# Patient Record
Sex: Female | Born: 1950 | Race: Black or African American | Hispanic: No | Smoking: Former smoker
Health system: Southern US, Community
[De-identification: ages and names within clinical notes are randomized; demographics above are authoritative.]

## PROBLEM LIST (undated history)

## (undated) DIAGNOSIS — R51 Headache: Secondary | ICD-10-CM

## (undated) DIAGNOSIS — I10 Essential (primary) hypertension: Secondary | ICD-10-CM

## (undated) DIAGNOSIS — K219 Gastro-esophageal reflux disease without esophagitis: Secondary | ICD-10-CM

## (undated) DIAGNOSIS — R7303 Prediabetes: Secondary | ICD-10-CM

## (undated) DIAGNOSIS — H40053 Ocular hypertension, bilateral: Secondary | ICD-10-CM

## (undated) DIAGNOSIS — J45909 Unspecified asthma, uncomplicated: Secondary | ICD-10-CM

## (undated) DIAGNOSIS — R519 Headache, unspecified: Secondary | ICD-10-CM

## (undated) DIAGNOSIS — M1990 Unspecified osteoarthritis, unspecified site: Secondary | ICD-10-CM

## (undated) HISTORY — PX: TUBAL LIGATION: SHX77

---
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
## 2000-05-01 ENCOUNTER — Encounter: Payer: Self-pay | Admitting: Emergency Medicine

## 2000-05-01 ENCOUNTER — Emergency Department (HOSPITAL_COMMUNITY): Admission: EM | Admit: 2000-05-01 | Discharge: 2000-05-01 | Payer: Self-pay | Admitting: Emergency Medicine

## 2001-03-02 ENCOUNTER — Encounter: Payer: Self-pay | Admitting: Emergency Medicine

## 2001-03-02 ENCOUNTER — Inpatient Hospital Stay (HOSPITAL_COMMUNITY): Admission: EM | Admit: 2001-03-02 | Discharge: 2001-03-06 | Payer: Self-pay | Admitting: Emergency Medicine

## 2001-03-03 ENCOUNTER — Encounter: Payer: Self-pay | Admitting: Cardiology

## 2001-03-09 ENCOUNTER — Emergency Department (HOSPITAL_COMMUNITY): Admission: EM | Admit: 2001-03-09 | Discharge: 2001-03-09 | Payer: Self-pay | Admitting: Emergency Medicine

## 2001-04-06 ENCOUNTER — Encounter: Payer: Self-pay | Admitting: Cardiology

## 2001-04-06 ENCOUNTER — Ambulatory Visit (HOSPITAL_COMMUNITY): Admission: RE | Admit: 2001-04-06 | Discharge: 2001-04-06 | Payer: Self-pay | Admitting: Cardiology

## 2001-12-04 ENCOUNTER — Emergency Department (HOSPITAL_COMMUNITY): Admission: EM | Admit: 2001-12-04 | Discharge: 2001-12-05 | Payer: Self-pay | Admitting: Emergency Medicine

## 2001-12-05 ENCOUNTER — Encounter: Payer: Self-pay | Admitting: Emergency Medicine

## 2004-11-28 ENCOUNTER — Emergency Department: Payer: Self-pay | Admitting: Emergency Medicine

## 2005-10-21 ENCOUNTER — Ambulatory Visit: Payer: Self-pay | Admitting: Unknown Physician Specialty

## 2006-02-26 ENCOUNTER — Ambulatory Visit: Payer: Self-pay | Admitting: Unknown Physician Specialty

## 2006-10-22 ENCOUNTER — Emergency Department: Payer: Self-pay | Admitting: Emergency Medicine

## 2008-01-20 ENCOUNTER — Ambulatory Visit: Payer: Self-pay | Admitting: Unknown Physician Specialty

## 2008-03-29 ENCOUNTER — Ambulatory Visit: Payer: Self-pay | Admitting: Unknown Physician Specialty

## 2009-05-08 IMAGING — MG MAM DGTL SCREENING MAMMO W/CAD
1 series · 4 of 4 positions shown · non-contrast
Comparison: none

REASON FOR EXAM: Screening Mammo
COMMENTS:

[R CC · right · 4 of 4 slices shown]
[im 1/4]
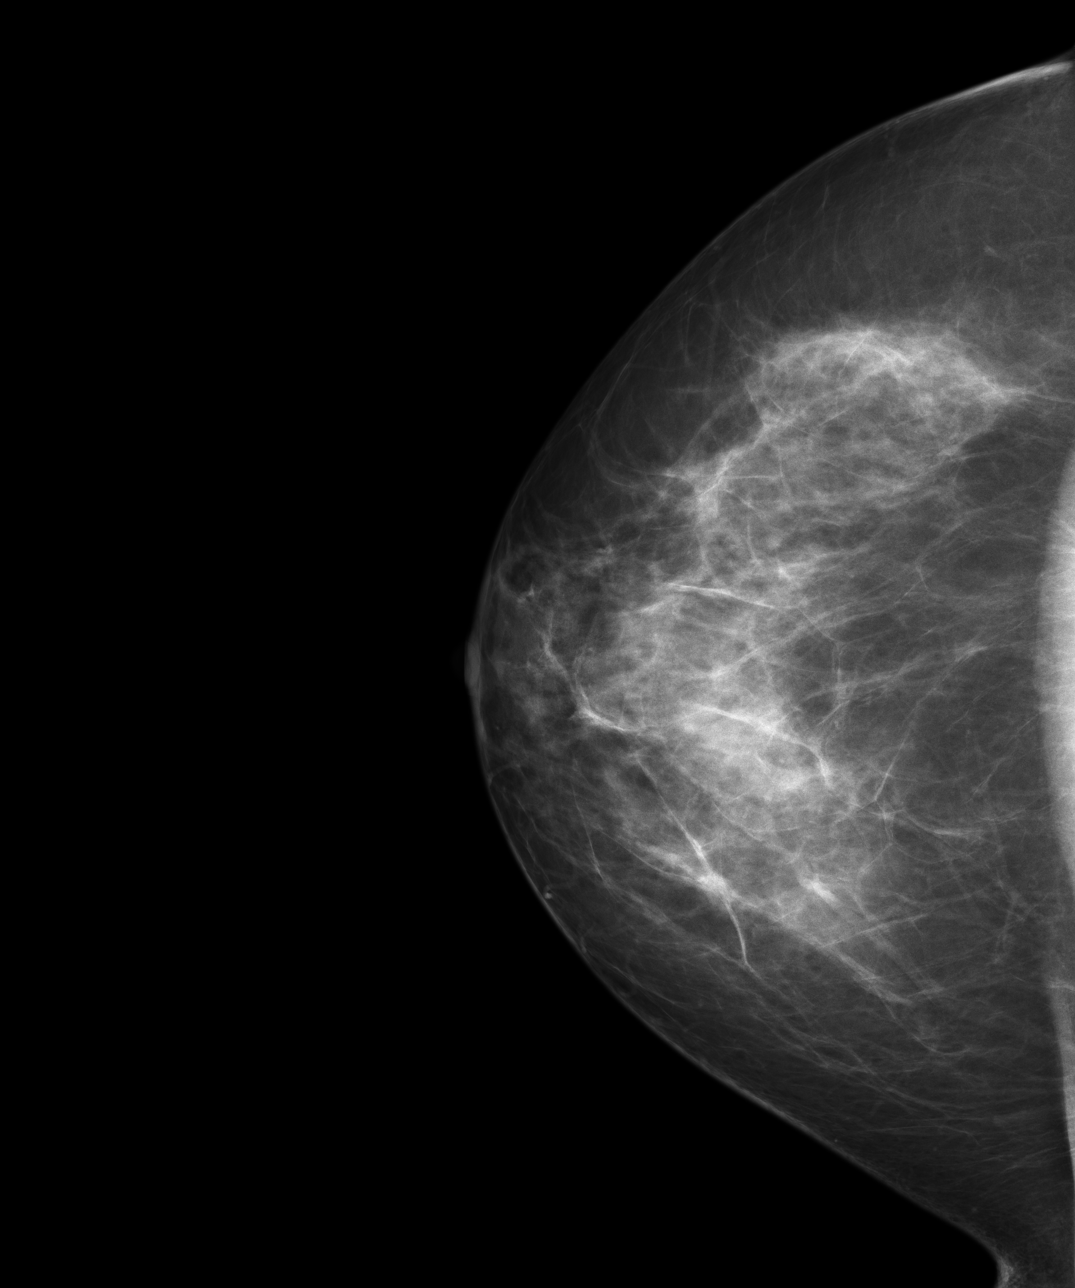
[im 2/4]
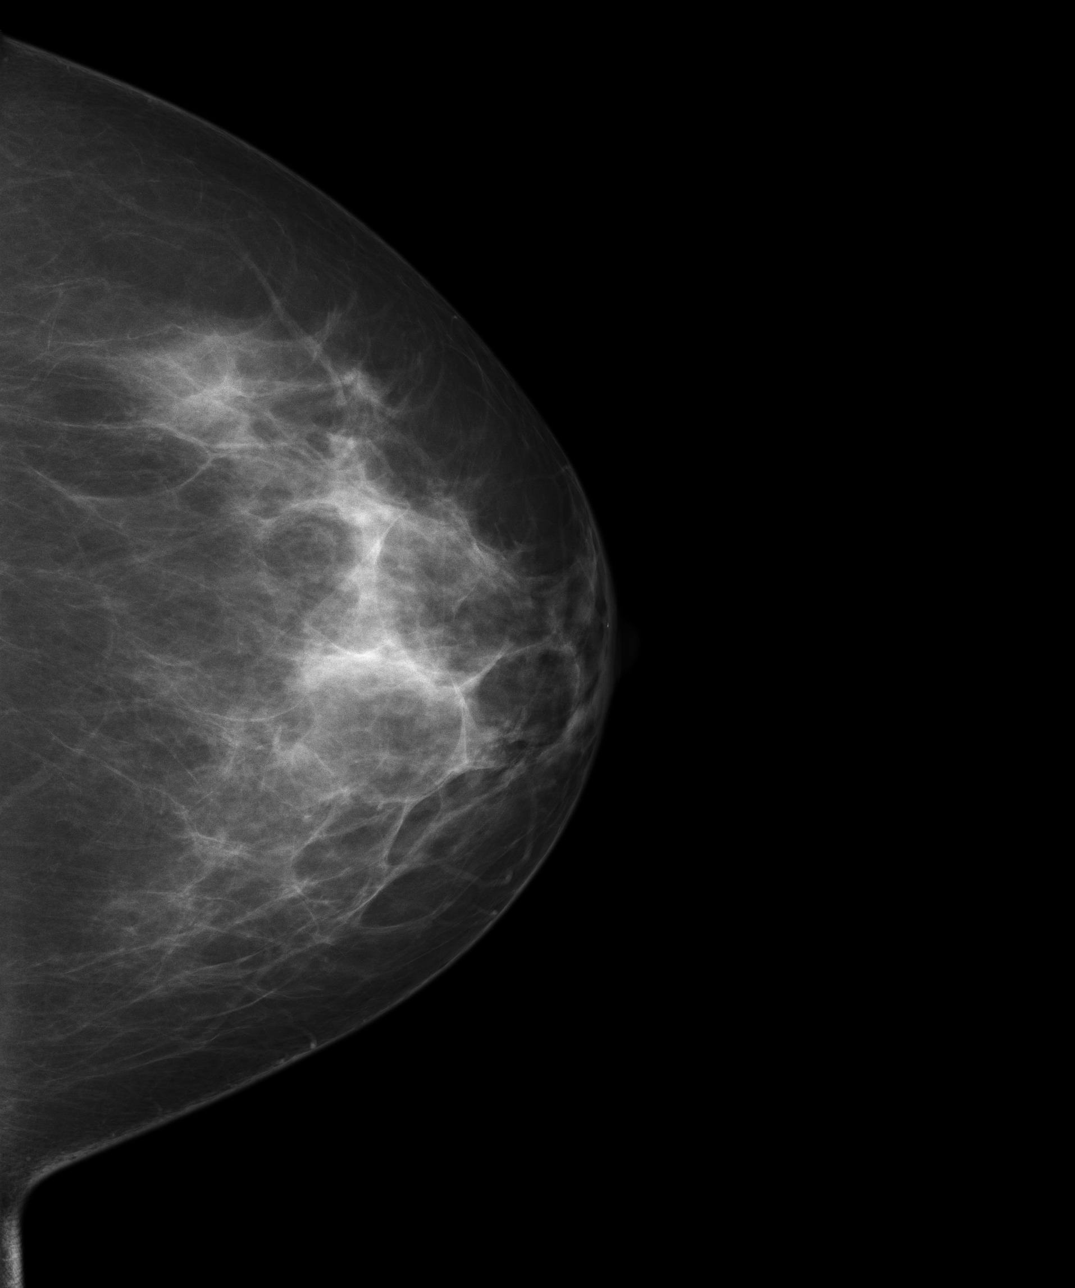
[im 3/4]
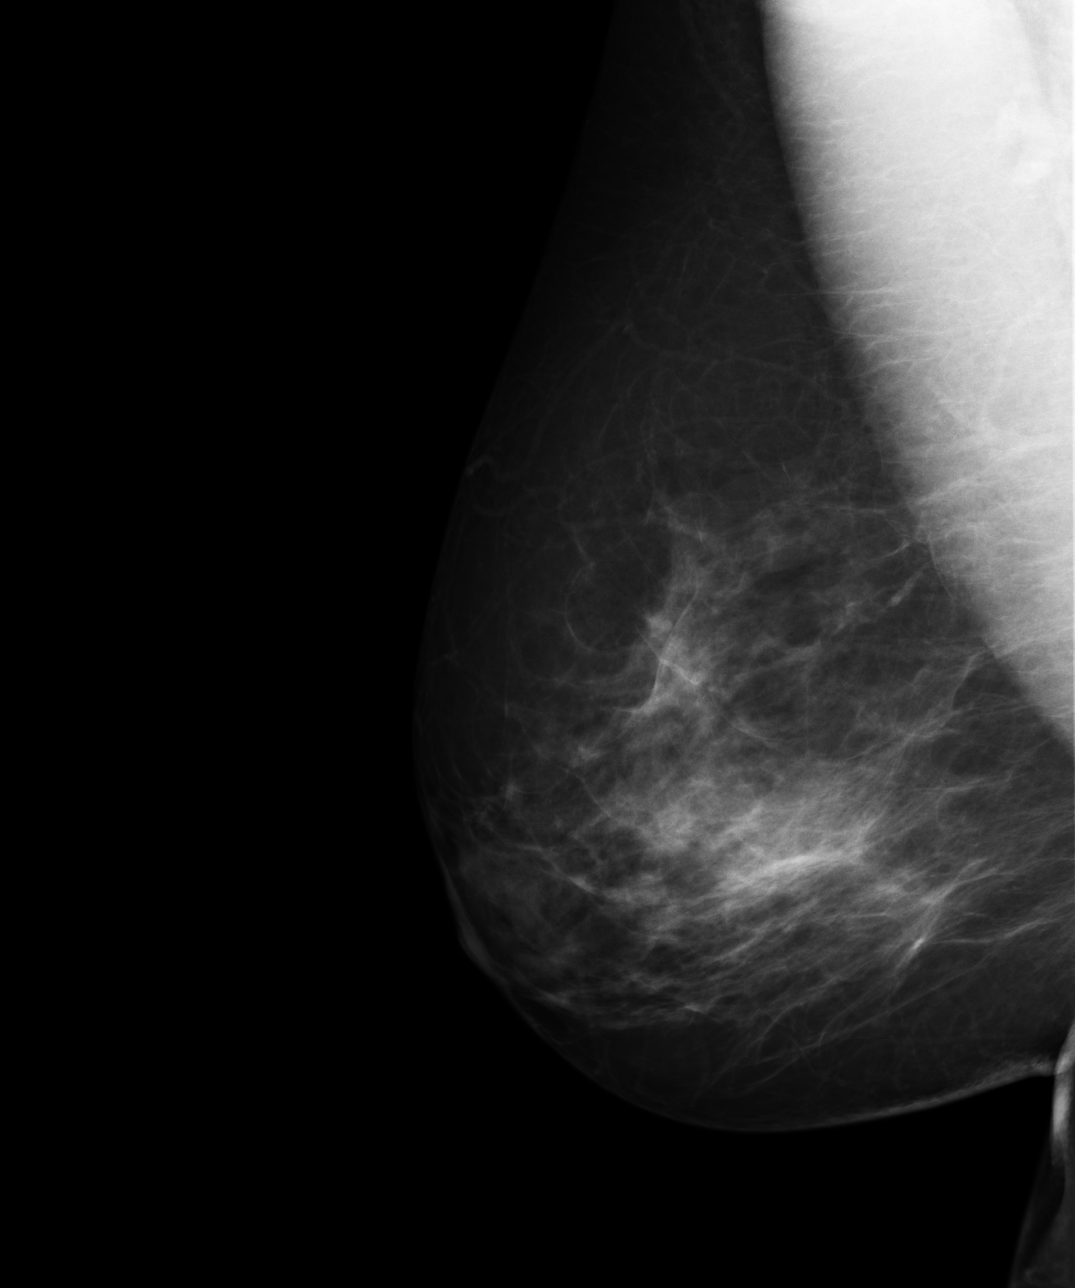
[im 4/4]
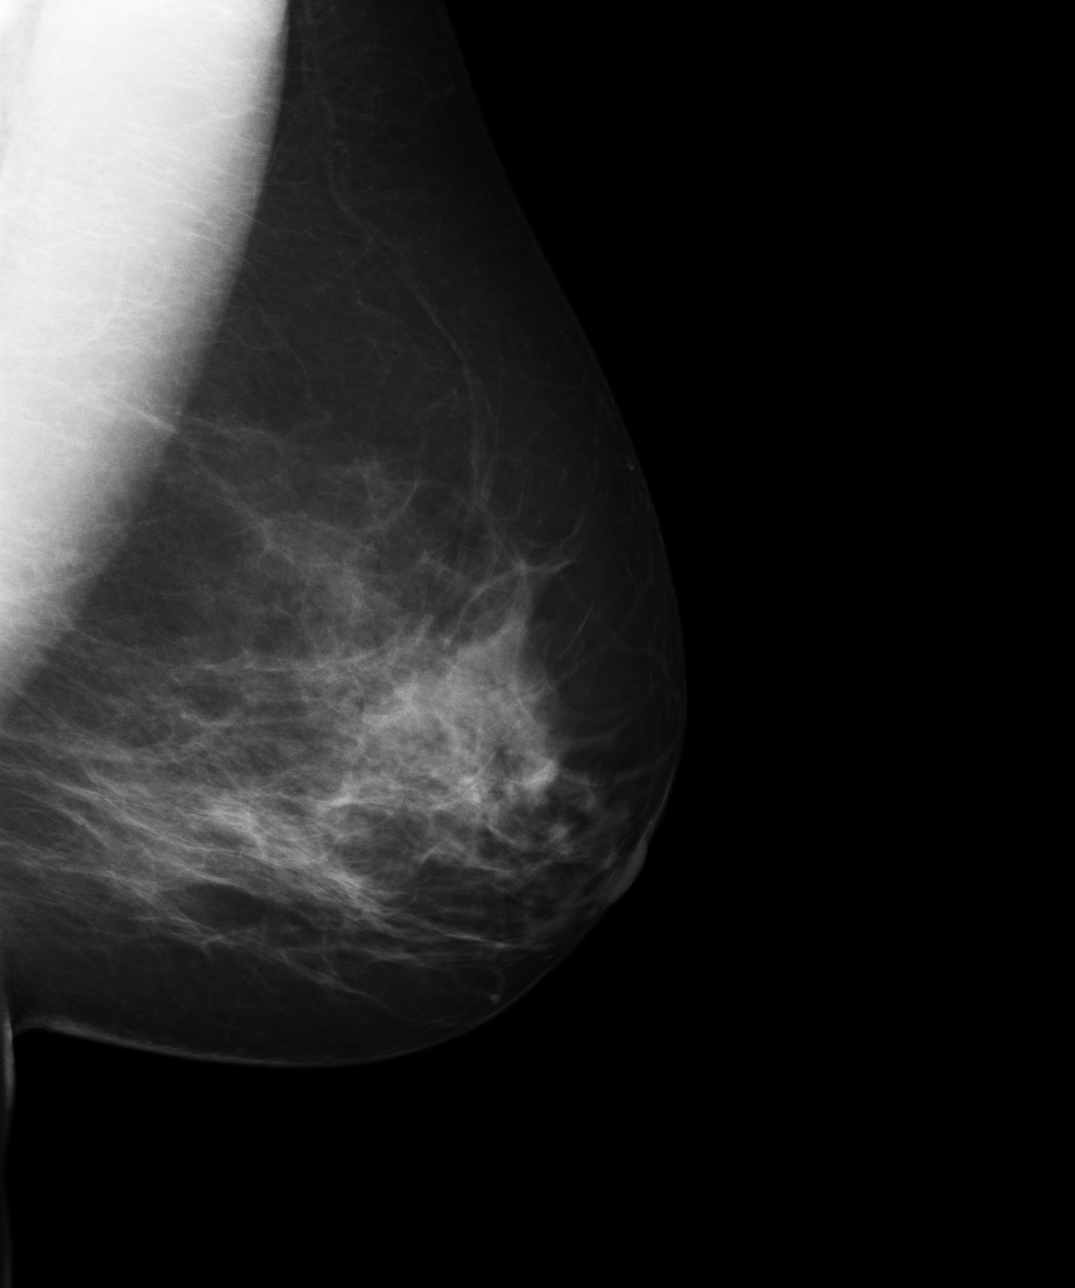

[4 of 4 positions shown; findings below may reference images not displayed]

PROCEDURE:     MAM - MAM DGTL SCREENING MAMMO W/CAD  - March 29, 2008  [DATE]

RESULT:     Comparison is made to a prior film screen study of 10/21/2005
and 03/08/2003.

The breasts exhibit a moderately-dense parenchymal pattern.  There is no
dominant mass.  There are no malignant-appearing groupings of
microcalcification.  Benign-appearing lymph nodes are noted in the RIGHT
axillary region.
IMPRESSION: I see no finding suspicious for malignancy.

BI-RADS:  Category 2  Benign Finding.

RECOMMENDATION: Please continue to encourage yearly mammographic follow up.

A NEGATIVE MAMMOGRAM REPORT DOES NOT PRECLUDE BIOPSY OR OTHER EVALUATION OF
A CLINICALLY PALPABLE OR OTHERWISE SUSPICIOUS MASS OR LESION.  BREAST CANCER
MAY NOT BE DETECTED BY MAMMOGRAPHY IN UP TO 10% OF CASES.

## 2009-05-27 ENCOUNTER — Ambulatory Visit: Payer: Self-pay | Admitting: Unknown Physician Specialty

## 2009-07-26 ENCOUNTER — Inpatient Hospital Stay: Payer: Self-pay | Admitting: Internal Medicine

## 2011-02-28 ENCOUNTER — Emergency Department: Payer: Self-pay | Admitting: Emergency Medicine

## 2012-02-02 ENCOUNTER — Ambulatory Visit: Payer: Self-pay

## 2012-12-06 ENCOUNTER — Emergency Department: Payer: Self-pay | Admitting: Emergency Medicine

## 2012-12-06 LAB — CBC
HCT: 45.6 % (ref 35.0–47.0)
HGB: 15.1 g/dL (ref 12.0–16.0)
MCH: 27 pg (ref 26.0–34.0)
MCHC: 33.1 g/dL (ref 32.0–36.0)
MCV: 82 fL (ref 80–100)
Platelet: 293 10*3/uL (ref 150–440)
RBC: 5.59 10*6/uL — ABNORMAL HIGH (ref 3.80–5.20)
RDW: 15 % — ABNORMAL HIGH (ref 11.5–14.5)
WBC: 7 10*3/uL (ref 3.6–11.0)

## 2012-12-06 LAB — BASIC METABOLIC PANEL
Anion Gap: 4 — ABNORMAL LOW (ref 7–16)
BUN: 13 mg/dL (ref 7–18)
Calcium, Total: 9.5 mg/dL (ref 8.5–10.1)
Chloride: 101 mmol/L (ref 98–107)
Co2: 33 mmol/L — ABNORMAL HIGH (ref 21–32)
Creatinine: 0.79 mg/dL (ref 0.60–1.30)
EGFR (African American): >60
EGFR (Non-African Amer.): >60
Glucose: 126 mg/dL — ABNORMAL HIGH (ref 65–99)
Osmolality: 277 (ref 275–301)
Potassium: 3.4 mmol/L — ABNORMAL LOW (ref 3.5–5.1)
Sodium: 138 mmol/L (ref 136–145)

## 2012-12-06 LAB — TROPONIN I
Troponin-I: <0.02 ng/mL
Troponin-I: <0.02 ng/mL

## 2013-12-03 ENCOUNTER — Emergency Department: Payer: Self-pay | Admitting: Emergency Medicine

## 2013-12-03 LAB — URINALYSIS, COMPLETE
BILIRUBIN, UR: NEGATIVE
Bacteria: NONE SEEN
Blood: NEGATIVE
GLUCOSE, UR: NEGATIVE mg/dL (ref 0–75)
LEUKOCYTE ESTERASE: NEGATIVE
NITRITE: NEGATIVE
Ph: 5 (ref 4.5–8.0)
Protein: NEGATIVE
RBC,UR: <1 /HPF (ref 0–5)
Specific Gravity: 1.019 (ref 1.003–1.030)
Squamous Epithelial: 3

## 2013-12-03 LAB — TROPONIN I

## 2013-12-03 LAB — BASIC METABOLIC PANEL
Anion Gap: 4 — ABNORMAL LOW (ref 7–16)
BUN: 18 mg/dL (ref 7–18)
CHLORIDE: 101 mmol/L (ref 98–107)
CO2: 31 mmol/L (ref 21–32)
Calcium, Total: 9.3 mg/dL (ref 8.5–10.1)
Creatinine: 0.79 mg/dL (ref 0.60–1.30)
Glucose: 85 mg/dL (ref 65–99)
Osmolality: 273 (ref 275–301)
Potassium: 3.5 mmol/L (ref 3.5–5.1)
SODIUM: 136 mmol/L (ref 136–145)

## 2013-12-03 LAB — CBC WITH DIFFERENTIAL/PLATELET
BASOS ABS: 0.1 10*3/uL (ref 0.0–0.1)
Basophil %: 1.1 %
EOS PCT: 2.2 %
Eosinophil #: 0.1 10*3/uL (ref 0.0–0.7)
HCT: 45 % (ref 35.0–47.0)
HGB: 14.8 g/dL (ref 12.0–16.0)
LYMPHS ABS: 2.3 10*3/uL (ref 1.0–3.6)
Lymphocyte %: 37.1 %
MCH: 26.6 pg (ref 26.0–34.0)
MCHC: 32.8 g/dL (ref 32.0–36.0)
MCV: 81 fL (ref 80–100)
MONO ABS: 0.5 x10 3/mm (ref 0.2–0.9)
Monocyte %: 8 %
Neutrophil #: 3.2 10*3/uL (ref 1.4–6.5)
Neutrophil %: 51.6 %
Platelet: 274 10*3/uL (ref 150–440)
RBC: 5.54 10*6/uL — ABNORMAL HIGH (ref 3.80–5.20)
RDW: 14.4 % (ref 11.5–14.5)
WBC: 6.2 10*3/uL (ref 3.6–11.0)

## 2014-02-13 DIAGNOSIS — M1990 Unspecified osteoarthritis, unspecified site: Secondary | ICD-10-CM | POA: Insufficient documentation

## 2014-02-19 ENCOUNTER — Ambulatory Visit: Payer: Self-pay | Admitting: Physician Assistant

## 2014-10-02 ENCOUNTER — Ambulatory Visit: Payer: Self-pay | Admitting: Physician Assistant

## 2014-10-11 DIAGNOSIS — M170 Bilateral primary osteoarthritis of knee: Secondary | ICD-10-CM | POA: Insufficient documentation

## 2014-10-11 DIAGNOSIS — M5136 Other intervertebral disc degeneration, lumbar region: Secondary | ICD-10-CM | POA: Insufficient documentation

## 2014-10-11 DIAGNOSIS — M5416 Radiculopathy, lumbar region: Secondary | ICD-10-CM | POA: Insufficient documentation

## 2014-10-11 DIAGNOSIS — M51369 Other intervertebral disc degeneration, lumbar region without mention of lumbar back pain or lower extremity pain: Secondary | ICD-10-CM | POA: Insufficient documentation

## 2014-10-15 ENCOUNTER — Ambulatory Visit: Payer: Self-pay | Admitting: Physician Assistant

## 2014-11-13 ENCOUNTER — Ambulatory Visit
Admit: 2014-11-13 | Disposition: A | Discharge status: Home / Self Care | Payer: Self-pay | Attending: Physician Assistant | Admitting: Physician Assistant

## 2015-02-15 ENCOUNTER — Other Ambulatory Visit: Payer: Self-pay | Admitting: Otolaryngology

## 2015-02-15 DIAGNOSIS — R1310 Dysphagia, unspecified: Secondary | ICD-10-CM

## 2015-02-22 ENCOUNTER — Ambulatory Visit: Payer: Self-pay

## 2015-02-25 ENCOUNTER — Ambulatory Visit: Admission: RE | Admit: 2015-02-25 | Payer: Self-pay | Source: Ambulatory Visit

## 2015-03-21 ENCOUNTER — Ambulatory Visit: Admission: RE | Admit: 2015-03-21 | Payer: Commercial Managed Care - HMO | Source: Ambulatory Visit

## 2015-03-26 ENCOUNTER — Other Ambulatory Visit: Payer: Self-pay | Admitting: Physician Assistant

## 2015-03-26 DIAGNOSIS — Z1231 Encounter for screening mammogram for malignant neoplasm of breast: Secondary | ICD-10-CM

## 2015-03-26 DIAGNOSIS — K219 Gastro-esophageal reflux disease without esophagitis: Secondary | ICD-10-CM | POA: Insufficient documentation

## 2015-03-26 DIAGNOSIS — E119 Type 2 diabetes mellitus without complications: Secondary | ICD-10-CM | POA: Insufficient documentation

## 2015-04-02 ENCOUNTER — Ambulatory Visit: Payer: Commercial Managed Care - HMO | Attending: Physician Assistant

## 2015-04-26 ENCOUNTER — Other Ambulatory Visit: Payer: Self-pay | Admitting: Otolaryngology

## 2015-04-26 ENCOUNTER — Ambulatory Visit
Admission: RE | Admit: 2015-04-26 | Discharge: 2015-04-26 | Disposition: A | Discharge status: Home / Self Care | Payer: Commercial Managed Care - HMO | Source: Ambulatory Visit | Attending: Otolaryngology | Admitting: Otolaryngology

## 2015-04-26 DIAGNOSIS — R05 Cough: Secondary | ICD-10-CM

## 2015-04-26 DIAGNOSIS — R0989 Other specified symptoms and signs involving the circulatory and respiratory systems: Secondary | ICD-10-CM

## 2015-04-26 DIAGNOSIS — R059 Cough, unspecified: Secondary | ICD-10-CM

## 2015-05-03 ENCOUNTER — Ambulatory Visit: Payer: Commercial Managed Care - HMO

## 2015-06-05 ENCOUNTER — Ambulatory Visit
Admission: RE | Admit: 2015-06-05 | Discharge: 2015-06-05 | Disposition: A | Discharge status: Home / Self Care | Payer: Commercial Managed Care - HMO | Source: Ambulatory Visit | Attending: Otolaryngology | Admitting: Otolaryngology

## 2015-06-05 DIAGNOSIS — F458 Other somatoform disorders: Secondary | ICD-10-CM | POA: Insufficient documentation

## 2015-06-05 DIAGNOSIS — R0989 Other specified symptoms and signs involving the circulatory and respiratory systems: Secondary | ICD-10-CM

## 2015-11-15 DIAGNOSIS — J4520 Mild intermittent asthma, uncomplicated: Secondary | ICD-10-CM | POA: Insufficient documentation

## 2016-04-29 ENCOUNTER — Other Ambulatory Visit: Payer: Self-pay | Admitting: Physician Assistant

## 2016-04-29 DIAGNOSIS — Z1231 Encounter for screening mammogram for malignant neoplasm of breast: Secondary | ICD-10-CM

## 2016-05-15 ENCOUNTER — Ambulatory Visit: Payer: Commercial Managed Care - HMO | Attending: Physician Assistant

## 2016-06-08 ENCOUNTER — Emergency Department: Payer: Commercial Managed Care - HMO

## 2016-06-08 ENCOUNTER — Emergency Department
Admission: EM | Admit: 2016-06-08 | Discharge: 2016-06-08 | Disposition: A | Discharge status: Home / Self Care | Payer: Commercial Managed Care - HMO | Attending: Student | Admitting: Student

## 2016-06-08 ENCOUNTER — Encounter: Payer: Self-pay | Admitting: *Deleted

## 2016-06-08 DIAGNOSIS — R42 Dizziness and giddiness: Secondary | ICD-10-CM

## 2016-06-08 DIAGNOSIS — I10 Essential (primary) hypertension: Secondary | ICD-10-CM | POA: Diagnosis not present

## 2016-06-08 DIAGNOSIS — Z87891 Personal history of nicotine dependence: Secondary | ICD-10-CM | POA: Insufficient documentation

## 2016-06-08 LAB — COMPREHENSIVE METABOLIC PANEL
ALBUMIN: 4.3 g/dL (ref 3.5–5.0)
ALT: 19 U/L (ref 14–54)
AST: 27 U/L (ref 15–41)
Alkaline Phosphatase: 74 U/L (ref 38–126)
Anion gap: 6 (ref 5–15)
BUN: 17 mg/dL (ref 6–20)
CHLORIDE: 105 mmol/L (ref 101–111)
CO2: 28 mmol/L (ref 22–32)
Calcium: 9.3 mg/dL (ref 8.9–10.3)
Creatinine, Ser: 0.75 mg/dL (ref 0.44–1.00)
GFR calc Af Amer: >60 mL/min (ref >60)
GFR calc non Af Amer: >60 mL/min (ref >60)
GLUCOSE: 92 mg/dL (ref 65–99)
POTASSIUM: 3.9 mmol/L (ref 3.5–5.1)
Sodium: 139 mmol/L (ref 135–145)
Total Bilirubin: 0.5 mg/dL (ref 0.3–1.2)
Total Protein: 7.7 g/dL (ref 6.5–8.1)

## 2016-06-08 LAB — CBC WITH DIFFERENTIAL/PLATELET
BASOS ABS: 0 10*3/uL (ref 0–0.1)
Basophils Relative: 1 %
EOS PCT: 4 %
Eosinophils Absolute: 0.2 10*3/uL (ref 0–0.7)
HEMATOCRIT: 43 % (ref 35.0–47.0)
Hemoglobin: 14 g/dL (ref 12.0–16.0)
LYMPHS ABS: 2.7 10*3/uL (ref 1.0–3.6)
LYMPHS PCT: 41 %
MCH: 26.6 pg (ref 26.0–34.0)
MCHC: 32.5 g/dL (ref 32.0–36.0)
MCV: 81.8 fL (ref 80.0–100.0)
MONO ABS: 0.5 10*3/uL (ref 0.2–0.9)
MONOS PCT: 7 %
NEUTROS ABS: 3 10*3/uL (ref 1.4–6.5)
Neutrophils Relative %: 47 %
PLATELETS: 249 10*3/uL (ref 150–440)
RBC: 5.26 MIL/uL — ABNORMAL HIGH (ref 3.80–5.20)
RDW: 15.4 % — AB (ref 11.5–14.5)
WBC: 6.4 10*3/uL (ref 3.6–11.0)

## 2016-06-08 LAB — TROPONIN I: Troponin I: <0.03 ng/mL (ref <0.03)

## 2016-06-08 NOTE — ED Notes (Signed)
Pt presents with HTN, dizziness, and headache. States normally BP is 120/80 but today was 185/110. States she does take hydrochlorothiazide but doesn't take it everyday. States today she took 2 of those and aleeve. Pt states headache began today and has some vision changes. Pt is alert and oriented x4.

## 2016-06-08 NOTE — ED Provider Notes (Addendum)
[facility] Emergency Department Provider Note   ____________________________________________   First MD Initiated Contact with Patient 06/08/16 1910     (approximate)  I have reviewed the triage vital signs and the nursing notes.   HISTORY  Chief Complaint Hypertension and Dizziness    HPI Michele Jefferson is a 65 y.o. female with hypertension who presents for evaluation of hypertension and dizziness, gradual onset today, constant, improved after she took her antihypertensive medications, currently moderate. Patient reports  She had a headache this morning, took her blood pressure and it was elevated. She reports she took 2 Aleve and this causes her headache to resolve however she reports today she has been "dizzy and lightheaded". She denies any room spinning dizziness. She denies any numbness or weakness in the arms or legs, speech difficulty or vision change. . She denies any chest pain or difficulty breathing. She denies any vomiting, diarrhea or fevers or chills. She reports that she has been noncompliant with her home antihypertensive medications only taking them "when I really need them".   PMH: HTN  There are no active problems to display for this patient.   No past surgical history on file.  Prior to Admission medications   Not on File    Allergies Review of patient's allergies indicates no known allergies.  No family history on file.  Social History Social History  Substance Use Topics  . Smoking status: Former Games developermoker  . Smokeless tobacco: Never Used  . Alcohol use No    Review of Systems Constitutional: No fever/chills Eyes: No visual changes. ENT: No sore throat. Cardiovascular: Denies chest pain. Respiratory: Denies shortness of breath. Gastrointestinal: No abdominal pain.  No nausea, no vomiting.  No diarrhea.  No constipation. Genitourinary: Negative for dysuria. Musculoskeletal: Negative for back pain. Skin: Negative  for rash. Neurological: Positive for headaches, focal weakness or numbness.  10-point ROS otherwise negative.  ____________________________________________   PHYSICAL EXAM:  Vitals:   06/08/16 1901 06/08/16 1949 06/08/16 2000 06/08/16 2030  BP: (!) 192/103 (!) 175/101 (!) 161/99 (!) 162/87  Pulse: 65 (!) 54 (!) 52 (!) 59  Resp: 20 18 10  (!) 21  Temp: 97.7 F (36.5 C)     TempSrc: Oral     SpO2: 95% 99% 98% 100%  Weight: 158 lb (71.7 kg)     Height: 5\' 2"  (1.575 m)       VITAL SIGNS: ED Triage Vitals [06/08/16 1901]  Enc Vitals Group     BP (!) 192/103     Pulse Rate 65     Resp 20     Temp 97.7 F (36.5 C)     Temp Source Oral     SpO2 95 %     Weight 158 lb (71.7 kg)     Height 5\' 2"  (1.575 m)     Head Circumference      Peak Flow      Pain Score      Pain Loc      Pain Edu?      Excl. in GC?     Constitutional: Alert and oriented. Well appearing and in no acute distress. Eyes: Conjunctivae are normal. PERRL. EOMI. Head: Atraumatic. Nose: No congestion/rhinnorhea. Mouth/Throat: Mucous membranes are moist.  Oropharynx non-erythematous. Neck: No stridor.  Supple without meningismus Cardiovascular: Normal rate, regular rhythm. Grossly normal heart sounds.  Good peripheral circulation. Respiratory: Normal respiratory effort.  No retractions. Lungs CTAB. Gastrointestinal: Soft and nontender. No distention.  No CVA  tenderness. Genitourinary: deferred Musculoskeletal: No lower extremity tenderness nor edema.  No joint effusions. Neurologic:  Normal speech and language. No gross focal neurologic deficits are appreciated. No gait instability. 5 out of 5 strength in bilateral upper and lower extremities, sensation intact to light touch throughout, cranial nerves II-XII intact, normal finger-nose. Skin:  Skin is warm, dry and intact. No rash noted. Psychiatric: Mood and affect are normal. Speech and behavior are normal.  ____________________________________________     LABS (all labs ordered are listed, but only abnormal results are displayed)  Labs Reviewed  CBC WITH DIFFERENTIAL/PLATELET - Abnormal; Notable for the following:       Result Value   RBC 5.26 (*)    RDW 15.4 (*)    All other components within normal limits  COMPREHENSIVE METABOLIC PANEL  TROPONIN I   ____________________________________________  EKG  ED ECG REPORT I, Gayla Doss, the attending physician, personally viewed and interpreted this ECG.   Date: 06/08/2016  EKG Time: 19:05  Rate: 74  Rhythm: normal sinus rhythm with sinus arrhythmia  Axis: normal  Intervals:none  ST&T Change: No acute ST elevation or acute ST depression. Nonspecific T-wave flattening in V2 through V6.  ____________________________________________  RADIOLOGY  CT head IMPRESSION:  No acute intracranial abnormality is identified. Unremarkable CT of  head for age.      CXR   IMPRESSION:  No active cardiopulmonary disease.      ____________________________________________   PROCEDURES  Procedure(s) performed: None  Procedures  Critical Care performed: No  ____________________________________________   INITIAL IMPRESSION / ASSESSMENT AND PLAN / ED COURSE  Pertinent labs & imaging results that were available during my care of the patient were reviewed by me and considered in my medical decision making (see chart for details).  Michele Jefferson is a 65 y.o. female with hypertension who presents for evaluation of hypertension and dizziness, gradual onset today, constant, improved after she took her antihypertensive medications, currently moderate. On arrival to the emergency department she is well-appearing and in no acute distress. Her blood pressure improved to 175/101 prior to any intervention in the ER. The remainder of her vital signs are stable and she is afebrile. She has a benign physical examination, intact neurological examination. EKG not consistent with acute  ischemia, we'll obtain screening labs, chest x-ray, CT head given her complaints to evaluate for any evidence of end organ dysfunction, continue to monitor.  ----------------------------------------- 8:28 PM on 06/08/2016 ----------------------------------------- CBC, CMP, troponin unremarkable. CT head and chest x-ray negative for any acute cranial abnormality or any acute cardiopulmonary pathology. Patient reports that she feels well. I discussed with her that she should take all of her medications as prescribed by her primary care doctor, we discussed return precautions and need for close PCP follow-up and she is comfortable with the discharge plan. DC home.  Clinical Course     ____________________________________________   FINAL CLINICAL IMPRESSION(S) / ED DIAGNOSES  Final diagnoses:  Lightheadedness  Essential hypertension      NEW MEDICATIONS STARTED DURING THIS VISIT:  New Prescriptions   No medications on file     Note:  This document was prepared using Dragon voice recognition software and may include unintentional dictation errors.    Gayla Doss, MD 06/08/16 2029    Gayla Doss, MD 06/08/16 2045

## 2016-06-08 NOTE — ED Notes (Signed)
Pt transferred to x-ray via stretcher, than CT after x-ray complete.

## 2016-06-08 NOTE — ED Triage Notes (Addendum)
Pt reports high blood pressure today after taking 2 blood pressures pills.  Pt had a headache and took aleve with relief of pain.  Pt also  Reports dizziness.   No  Chest pain or sob.   Pt alert  Speech clear.   Pt only takes bp pills as needed per pt.

## 2016-06-26 ENCOUNTER — Other Ambulatory Visit: Payer: Self-pay | Admitting: Physician Assistant

## 2016-06-26 DIAGNOSIS — M79661 Pain in right lower leg: Secondary | ICD-10-CM

## 2016-07-03 ENCOUNTER — Ambulatory Visit
Admission: RE | Admit: 2016-07-03 | Discharge: 2016-07-03 | Disposition: A | Discharge status: Home / Self Care | Payer: Commercial Managed Care - HMO | Source: Ambulatory Visit | Attending: Physician Assistant | Admitting: Physician Assistant

## 2016-07-03 DIAGNOSIS — M79661 Pain in right lower leg: Secondary | ICD-10-CM

## 2016-08-01 ENCOUNTER — Encounter: Payer: Self-pay | Admitting: Emergency Medicine

## 2016-08-01 ENCOUNTER — Emergency Department
Admission: EM | Admit: 2016-08-01 | Discharge: 2016-08-01 | Disposition: A | Discharge status: Home / Self Care | Payer: Commercial Managed Care - HMO | Attending: Emergency Medicine | Admitting: Emergency Medicine

## 2016-08-01 DIAGNOSIS — S199XXA Unspecified injury of neck, initial encounter: Secondary | ICD-10-CM | POA: Diagnosis present

## 2016-08-01 DIAGNOSIS — Y9389 Activity, other specified: Secondary | ICD-10-CM | POA: Insufficient documentation

## 2016-08-01 DIAGNOSIS — Y92410 Unspecified street and highway as the place of occurrence of the external cause: Secondary | ICD-10-CM | POA: Insufficient documentation

## 2016-08-01 DIAGNOSIS — E119 Type 2 diabetes mellitus without complications: Secondary | ICD-10-CM | POA: Diagnosis not present

## 2016-08-01 DIAGNOSIS — S161XXA Strain of muscle, fascia and tendon at neck level, initial encounter: Secondary | ICD-10-CM | POA: Insufficient documentation

## 2016-08-01 DIAGNOSIS — M62838 Other muscle spasm: Secondary | ICD-10-CM

## 2016-08-01 DIAGNOSIS — Z87891 Personal history of nicotine dependence: Secondary | ICD-10-CM | POA: Diagnosis not present

## 2016-08-01 DIAGNOSIS — Y999 Unspecified external cause status: Secondary | ICD-10-CM | POA: Diagnosis not present

## 2016-08-01 DIAGNOSIS — S39012A Strain of muscle, fascia and tendon of lower back, initial encounter: Secondary | ICD-10-CM | POA: Diagnosis not present

## 2016-08-01 DIAGNOSIS — I10 Essential (primary) hypertension: Secondary | ICD-10-CM | POA: Diagnosis not present

## 2016-08-01 HISTORY — DX: Essential (primary) hypertension: I10

## 2016-08-01 MED ORDER — CYCLOBENZAPRINE HCL 5 MG PO TABS: 5.0000 mg | ORAL_TABLET | Freq: Three times a day (TID) | ORAL | 0 refills | Status: DC | PRN

## 2016-08-01 MED ORDER — MELOXICAM 15 MG PO TABS: 15.0000 mg | ORAL_TABLET | Freq: Every day | ORAL | 0 refills | Status: DC
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
## 2016-08-19 ENCOUNTER — Encounter
Admission: RE | Admit: 2016-08-19 | Discharge: 2016-08-19 | Disposition: A | Discharge status: Home / Self Care | Payer: Commercial Managed Care - HMO | Source: Ambulatory Visit | Attending: Orthopedic Surgery | Admitting: Orthopedic Surgery

## 2016-08-19 DIAGNOSIS — Z01812 Encounter for preprocedural laboratory examination: Secondary | ICD-10-CM | POA: Diagnosis present

## 2016-08-19 HISTORY — DX: Prediabetes: R73.03

## 2016-08-19 HISTORY — DX: Unspecified osteoarthritis, unspecified site: M19.90

## 2016-08-19 HISTORY — DX: Ocular hypertension, bilateral: H40.053

## 2016-08-19 HISTORY — DX: Gastro-esophageal reflux disease without esophagitis: K21.9

## 2016-08-19 HISTORY — DX: Headache: R51

## 2016-08-19 HISTORY — DX: Unspecified asthma, uncomplicated: J45.909

## 2016-08-19 HISTORY — DX: Headache, unspecified: R51.9

## 2016-08-19 LAB — CBC
HCT: 43.3 % (ref 35.0–47.0)
HEMOGLOBIN: 14.6 g/dL (ref 12.0–16.0)
MCH: 26.9 pg (ref 26.0–34.0)
MCHC: 33.7 g/dL (ref 32.0–36.0)
MCV: 79.7 fL — ABNORMAL LOW (ref 80.0–100.0)
PLATELETS: 272 10*3/uL (ref 150–440)
RBC: 5.44 MIL/uL — AB (ref 3.80–5.20)
RDW: 14.2 % (ref 11.5–14.5)
WBC: 5.1 10*3/uL (ref 3.6–11.0)

## 2016-08-19 LAB — C-REACTIVE PROTEIN

## 2016-08-19 LAB — COMPREHENSIVE METABOLIC PANEL
ALK PHOS: 82 U/L (ref 38–126)
ALT: 15 U/L (ref 14–54)
AST: 21 U/L (ref 15–41)
Albumin: 4.2 g/dL (ref 3.5–5.0)
Anion gap: 9 (ref 5–15)
BUN: 23 mg/dL — AB (ref 6–20)
CALCIUM: 9.5 mg/dL (ref 8.9–10.3)
CHLORIDE: 98 mmol/L — AB (ref 101–111)
CO2: 31 mmol/L (ref 22–32)
CREATININE: 0.86 mg/dL (ref 0.44–1.00)
GFR calc Af Amer: >60 mL/min (ref >60)
GFR calc non Af Amer: >60 mL/min (ref >60)
GLUCOSE: 104 mg/dL — AB (ref 65–99)
Potassium: 2.5 mmol/L — CL (ref 3.5–5.1)
SODIUM: 138 mmol/L (ref 135–145)
Total Bilirubin: 0.7 mg/dL (ref 0.3–1.2)
Total Protein: 7.9 g/dL (ref 6.5–8.1)

## 2016-08-19 LAB — APTT: aPTT: 32 seconds (ref 24–36)

## 2016-08-19 LAB — SURGICAL PCR SCREEN
MRSA, PCR: NEGATIVE
STAPHYLOCOCCUS AUREUS: NEGATIVE

## 2016-08-19 LAB — TYPE AND SCREEN
ABO/RH(D): A POS
Antibody Screen: NEGATIVE

## 2016-08-19 LAB — PROTIME-INR
INR: 1.06
Prothrombin Time: 13.8 seconds (ref 11.4–15.2)

## 2016-08-19 LAB — SEDIMENTATION RATE: SED RATE: 8 mm/h (ref 0–30)

## 2016-08-19 NOTE — Pre-Procedure Instructions (Signed)
Message left on Michele Jefferson's voicemail at Dr. Elenor LegatoHooten's office that pt had a tooth extracted due to infection, has finished her antibiotic, has a "dry socket".  Pt has been to see dentist 4 times for this problem.  Instructed pt to contact Dr. Elenor LegatoHooten's office if tooth problem still exists next week.

## 2016-08-19 NOTE — Pre-Procedure Instructions (Signed)
Spoke with Michele Jefferson nurse regarding potassium level of 2.5 today and need for potassium supplement.  Michele Loselaine will contact pt's PCP and get her started on a potassium supplement and pt will come back to PAT on 08/26/16 for a potassium recheck and recheck to day of surgery.

## 2016-08-19 NOTE — Pre-Procedure Instructions (Signed)
Received a call from lab reporting a critical potassium value of 2.5.  This was faxed to Dr. Elenor LegatoHooten's office with a request to start pt on a potassium supplement. Pt will need to have her potassium rechecked prior to surgery and again the day of surgery.  Also mentioned the tooth pt had recently extracted due to infection, that she has finished her antibiotic, has a dry socket and pain.  Pt instructed to contact Dr. Elenor LegatoHooten's office if tooth problem has not resolved next week.

## 2016-08-19 NOTE — Patient Instructions (Addendum)
  Your procedure is scheduled on: Monday Dec. 18 , 2017. Report to Same Day Surgery. To find out your arrival time please call [phone] between 1PM - 3PM on Friday Dec. 15, 2017 .  Remember: Instructions that are not followed completely may result in serious medical risk, up to and including death, or upon the discretion of your surgeon and anesthesiologist your surgery may need to be rescheduled.    _x___ 1. Do not eat food or drink liquids after midnight. No gum chewing or hard candies.     ____ 2. No Alcohol for 24 hours before or after surgery.   ____ 3. Bring all medications with you on the day of surgery if instructed.    __x__ 4. Notify your doctor if there is any change in your medical condition     (cold, fever, infections). If tooth problem still exists, contact Dr. Elenor LegatoHooten's office.    _____ 5. No smoking 24 hours prior to surgery.     Do not wear jewelry, make-up, hairpins, clips or nail polish.  Do not wear lotions, powders, or perfumes.   Do not shave 48 hours prior to surgery. Men may shave face and neck.  Do not bring valuables to the hospital.    [facility] for any belongings or valuables.               Contacts, dentures or bridgework may not be worn into surgery.  Leave your suitcase in the car. After surgery it may be brought to your room.  For patients admitted to the hospital, discharge time is determined by your treatment team.   Patients discharged the day of surgery will not be allowed to drive home.    Please read over the following fact sheets that you were given:   [facility] Pre[facility]  ____ Take these medicines the morning of surgery with A SIP OF WATER: NONE    ____ Fleet Enema (as directed)   _x___ Use CHG Soap as directed on instruction sheet  _x___ Use inhalers on the day of surgery and bring to hospital day of surgery  ____ Stop metformin 2 days prior to surgery    ____ Take 1/2 of usual insulin dose  the night before surgery and none on the morning of surgery.   ____ Stop Coumadin/Plavix/aspirin on does not apply.  __x__ Stop Anti-inflammatories such as Advil, Aleve, Ibuprofen, Motrin,meloxicam (MOBIC), Naproxen, Naprosyn, Goodies powders or aspirin products 7 days  prior to surgery. OK to take Tylenol.   ____ Stop supplements until after surgery.    ____ Bring C-Pap to the hospital.

## 2016-08-20 LAB — URINE CULTURE: SPECIAL REQUESTS: NORMAL

## 2016-08-20 LAB — HEMOGLOBIN A1C
HEMOGLOBIN A1C: 6.6 % — AB (ref 4.8–5.6)
MEAN PLASMA GLUCOSE: 143 mg/dL

## 2016-08-24 NOTE — Pre-Procedure Instructions (Signed)
Spoke with pt on the phone, she will come in on 08/26/16 for a recheck on her potassium and to go to [facility].

## 2016-08-26 ENCOUNTER — Encounter
Admission: RE | Admit: 2016-08-26 | Discharge: 2016-08-26 | Disposition: A | Discharge status: Home / Self Care | Payer: Commercial Managed Care - HMO | Source: Ambulatory Visit | Attending: Orthopedic Surgery | Admitting: Orthopedic Surgery

## 2016-08-26 DIAGNOSIS — Z01812 Encounter for preprocedural laboratory examination: Secondary | ICD-10-CM | POA: Insufficient documentation

## 2016-08-26 LAB — POTASSIUM: POTASSIUM: 4.3 mmol/L (ref 3.5–5.1)

## 2016-08-31 ENCOUNTER — Encounter: Payer: Self-pay | Admitting: *Deleted

## 2016-08-31 ENCOUNTER — Inpatient Hospital Stay: Payer: Commercial Managed Care - HMO | Admitting: Anesthesiology

## 2016-08-31 ENCOUNTER — Inpatient Hospital Stay: Payer: Commercial Managed Care - HMO

## 2016-08-31 ENCOUNTER — Encounter
Admission: RE | Disposition: A | Discharge status: Home Health | Payer: Self-pay | Source: Ambulatory Visit | Attending: Orthopedic Surgery

## 2016-08-31 ENCOUNTER — Inpatient Hospital Stay
Admission: RE | Admit: 2016-08-31 | Discharge: 2016-09-02 | DRG: 470 | Disposition: A | Discharge status: Home Health | Payer: Commercial Managed Care - HMO | Source: Ambulatory Visit | Attending: Orthopedic Surgery | Admitting: Orthopedic Surgery

## 2016-08-31 DIAGNOSIS — M25561 Pain in right knee: Secondary | ICD-10-CM | POA: Diagnosis present

## 2016-08-31 DIAGNOSIS — I10 Essential (primary) hypertension: Secondary | ICD-10-CM | POA: Diagnosis present

## 2016-08-31 DIAGNOSIS — K259 Gastric ulcer, unspecified as acute or chronic, without hemorrhage or perforation: Secondary | ICD-10-CM | POA: Insufficient documentation

## 2016-08-31 DIAGNOSIS — M1711 Unilateral primary osteoarthritis, right knee: Secondary | ICD-10-CM | POA: Diagnosis present

## 2016-08-31 DIAGNOSIS — M6281 Muscle weakness (generalized): Secondary | ICD-10-CM

## 2016-08-31 DIAGNOSIS — R001 Bradycardia, unspecified: Secondary | ICD-10-CM | POA: Insufficient documentation

## 2016-08-31 DIAGNOSIS — Z87891 Personal history of nicotine dependence: Secondary | ICD-10-CM

## 2016-08-31 DIAGNOSIS — R7303 Prediabetes: Secondary | ICD-10-CM | POA: Diagnosis present

## 2016-08-31 DIAGNOSIS — K219 Gastro-esophageal reflux disease without esophagitis: Secondary | ICD-10-CM | POA: Diagnosis present

## 2016-08-31 DIAGNOSIS — Z96659 Presence of unspecified artificial knee joint: Secondary | ICD-10-CM

## 2016-08-31 DIAGNOSIS — R262 Difficulty in walking, not elsewhere classified: Secondary | ICD-10-CM

## 2016-08-31 HISTORY — PX: KNEE ARTHROPLASTY: SHX992

## 2016-08-31 LAB — POCT I-STAT 4, (NA,K, GLUC, HGB,HCT)
Glucose, Bld: 102 mg/dL — ABNORMAL HIGH (ref 65–99)
HCT: 42 % (ref 36.0–46.0)
HEMOGLOBIN: 14.3 g/dL (ref 12.0–15.0)
POTASSIUM: 4.4 mmol/L (ref 3.5–5.1)
SODIUM: 141 mmol/L (ref 135–145)

## 2016-08-31 LAB — GLUCOSE, CAPILLARY
GLUCOSE-CAPILLARY: 106 mg/dL — AB (ref 65–99)
Glucose-Capillary: 97 mg/dL (ref 65–99)
Glucose-Capillary: 90 mg/dL (ref 65–99)
Glucose-Capillary: 116 mg/dL — ABNORMAL HIGH (ref 65–99)

## 2016-08-31 LAB — ABO/RH: ABO/RH(D): A POS

## 2016-08-31 SURGERY — ARTHROPLASTY, KNEE, TOTAL, USING IMAGELESS COMPUTER-ASSISTED NAVIGATION
Anesthesia: Spinal | Laterality: Right | Wound class: Clean

## 2016-08-31 MED ORDER — BUPIVACAINE LIPOSOME 1.3 % IJ SUSP
INTRAMUSCULAR | Status: AC
  Filled 2016-08-31: qty 20

## 2016-08-31 MED ORDER — CEFAZOLIN SODIUM-DEXTROSE 2-4 GM/100ML-% IV SOLN
INTRAVENOUS | Status: AC
  Filled 2016-08-31: qty 100

## 2016-08-31 MED ORDER — PROPOFOL 10 MG/ML IV BOLUS
INTRAVENOUS | Status: DC | PRN
  Administered 2016-08-31: 28 mg via INTRAVENOUS

## 2016-08-31 MED ORDER — EPHEDRINE SULFATE 50 MG/ML IJ SOLN
INTRAMUSCULAR | Status: DC | PRN
  Administered 2016-08-31: 5 mg via INTRAVENOUS

## 2016-08-31 MED ORDER — ONDANSETRON HCL 4 MG/2ML IJ SOLN: 4.0000 mg | Freq: Once | INTRAMUSCULAR | Status: DC | PRN

## 2016-08-31 MED ORDER — MAGNESIUM HYDROXIDE 400 MG/5ML PO SUSP
30.0000 mL | Freq: Every day | ORAL | Status: DC | PRN
  Administered 2016-09-01: 30 mL via ORAL
  Filled 2016-08-31: qty 30

## 2016-08-31 MED ORDER — CEFAZOLIN SODIUM-DEXTROSE 2-4 GM/100ML-% IV SOLN
2.0000 g | Freq: Four times a day (QID) | INTRAVENOUS | Status: AC
  Administered 2016-08-31 – 2016-09-01 (×4): 2 g via INTRAVENOUS
  Filled 2016-08-31 (×4): qty 100

## 2016-08-31 MED ORDER — DORZOLAMIDE HCL 2 % OP SOLN
1.0000 [drp] | Freq: Two times a day (BID) | OPHTHALMIC | Status: DC
Start: 2016-08-31 — End: 2016-09-02
  Administered 2016-08-31 – 2016-09-01 (×3): 1 [drp] via OPHTHALMIC
  Filled 2016-08-31: qty 10

## 2016-08-31 MED ORDER — BISACODYL 10 MG RE SUPP: 10.0000 mg | Freq: Every day | RECTAL | Status: DC | PRN

## 2016-08-31 MED ORDER — CEFAZOLIN SODIUM-DEXTROSE 2-4 GM/100ML-% IV SOLN
2.0000 g | INTRAVENOUS | Status: AC
  Administered 2016-08-31: 2 g via INTRAVENOUS

## 2016-08-31 MED ORDER — DIPHENHYDRAMINE HCL 12.5 MG/5ML PO ELIX: 12.5000 mg | ORAL_SOLUTION | ORAL | Status: DC | PRN

## 2016-08-31 MED ORDER — SODIUM CHLORIDE 0.9 % IV SOLN
1000.0000 mg | Freq: Once | INTRAVENOUS | Status: AC
  Administered 2016-08-31: 1000 mg via INTRAVENOUS
  Filled 2016-08-31: qty 10

## 2016-08-31 MED ORDER — NEOMYCIN-POLYMYXIN B GU 40-200000 IR SOLN
Status: DC | PRN
  Administered 2016-08-31: 16 mL

## 2016-08-31 MED ORDER — SODIUM CHLORIDE 0.9 % IV SOLN
INTRAVENOUS | Status: DC
  Administered 2016-08-31 (×2): via INTRAVENOUS

## 2016-08-31 MED ORDER — ACETAMINOPHEN 325 MG PO TABS
650.0000 mg | ORAL_TABLET | Freq: Four times a day (QID) | ORAL | Status: DC | PRN
  Administered 2016-09-01: 650 mg via ORAL
  Filled 2016-08-31 (×2): qty 2

## 2016-08-31 MED ORDER — FERROUS SULFATE 325 (65 FE) MG PO TABS
325.0000 mg | ORAL_TABLET | Freq: Two times a day (BID) | ORAL | Status: DC
  Administered 2016-08-31 – 2016-09-02 (×3): 325 mg via ORAL
  Filled 2016-08-31 (×3): qty 1

## 2016-08-31 MED ORDER — ACETAMINOPHEN 650 MG RE SUPP: 650.0000 mg | Freq: Four times a day (QID) | RECTAL | Status: DC | PRN

## 2016-08-31 MED ORDER — FENTANYL CITRATE (PF) 100 MCG/2ML IJ SOLN
INTRAMUSCULAR | Status: DC | PRN
  Administered 2016-08-31: 50 ug via INTRAVENOUS

## 2016-08-31 MED ORDER — SODIUM CHLORIDE 0.9 % IJ SOLN
INTRAMUSCULAR | Status: AC
  Filled 2016-08-31: qty 50

## 2016-08-31 MED ORDER — NEOMYCIN-POLYMYXIN B GU 40-200000 IR SOLN
Status: AC
  Filled 2016-08-31: qty 20

## 2016-08-31 MED ORDER — MENTHOL 3 MG MT LOZG: 1.0000 | LOZENGE | OROMUCOSAL | Status: DC | PRN

## 2016-08-31 MED ORDER — HYDRALAZINE HCL 10 MG PO TABS
10.0000 mg | ORAL_TABLET | Freq: Once | ORAL | Status: AC
  Administered 2016-08-31: 10 mg via ORAL
  Filled 2016-08-31: qty 1

## 2016-08-31 MED ORDER — ENOXAPARIN SODIUM 30 MG/0.3ML ~~LOC~~ SOLN
30.0000 mg | Freq: Two times a day (BID) | SUBCUTANEOUS | Status: DC
  Administered 2016-09-01 – 2016-09-02 (×3): 30 mg via SUBCUTANEOUS
  Filled 2016-08-31 (×3): qty 0.3

## 2016-08-31 MED ORDER — KETAMINE HCL 50 MG/ML IJ SOLN
INTRAMUSCULAR | Status: DC | PRN
  Administered 2016-08-31: 2.8 mg via INTRAMUSCULAR

## 2016-08-31 MED ORDER — GLYCOPYRROLATE 0.2 MG/ML IJ SOLN
INTRAMUSCULAR | Status: DC | PRN
  Administered 2016-08-31: 0.2 mg via INTRAVENOUS

## 2016-08-31 MED ORDER — TRANEXAMIC ACID 1000 MG/10ML IV SOLN
1000.0000 mg | INTRAVENOUS | Status: AC
  Administered 2016-08-31: 1000 mg via INTRAVENOUS
  Filled 2016-08-31: qty 10

## 2016-08-31 MED ORDER — FAMOTIDINE 20 MG PO TABS
20.0000 mg | ORAL_TABLET | Freq: Once | ORAL | Status: AC
  Administered 2016-08-31: 20 mg via ORAL

## 2016-08-31 MED ORDER — ALBUTEROL SULFATE (2.5 MG/3ML) 0.083% IN NEBU: 2.5000 mg | INHALATION_SOLUTION | Freq: Four times a day (QID) | RESPIRATORY_TRACT | Status: DC | PRN

## 2016-08-31 MED ORDER — ACETAMINOPHEN 10 MG/ML IV SOLN
1000.0000 mg | Freq: Four times a day (QID) | INTRAVENOUS | Status: AC
  Administered 2016-08-31 – 2016-09-01 (×3): 1000 mg via INTRAVENOUS
  Filled 2016-08-31 (×4): qty 100

## 2016-08-31 MED ORDER — METOCLOPRAMIDE HCL 10 MG PO TABS
10.0000 mg | ORAL_TABLET | Freq: Three times a day (TID) | ORAL | Status: DC
  Administered 2016-08-31 – 2016-09-02 (×5): 10 mg via ORAL
  Filled 2016-08-31 (×5): qty 1

## 2016-08-31 MED ORDER — ALBUTEROL SULFATE HFA 108 (90 BASE) MCG/ACT IN AERS: 1.0000 | INHALATION_SPRAY | Freq: Four times a day (QID) | RESPIRATORY_TRACT | Status: DC | PRN

## 2016-08-31 MED ORDER — PANTOPRAZOLE SODIUM 40 MG PO TBEC
40.0000 mg | DELAYED_RELEASE_TABLET | Freq: Two times a day (BID) | ORAL | Status: DC
  Administered 2016-08-31 – 2016-09-02 (×5): 40 mg via ORAL
  Filled 2016-08-31 (×5): qty 1

## 2016-08-31 MED ORDER — TAPENTADOL HCL 50 MG PO TABS
50.0000 mg | ORAL_TABLET | ORAL | Status: DC | PRN
  Administered 2016-08-31: 100 mg via ORAL
  Administered 2016-08-31 (×2): 50 mg via ORAL
  Administered 2016-09-01: 100 mg via ORAL
  Administered 2016-09-01 (×2): 50 mg via ORAL
  Filled 2016-08-31: qty 2
  Filled 2016-08-31 (×4): qty 1
  Filled 2016-08-31: qty 2

## 2016-08-31 MED ORDER — FAMOTIDINE 20 MG PO TABS
ORAL_TABLET | ORAL | Status: AC
  Filled 2016-08-31: qty 1

## 2016-08-31 MED ORDER — ACETAMINOPHEN 10 MG/ML IV SOLN
INTRAVENOUS | Status: DC | PRN
Start: 2016-08-31 — End: 2016-08-31
  Administered 2016-08-31: 1000 mg via INTRAVENOUS

## 2016-08-31 MED ORDER — ONDANSETRON HCL 4 MG/2ML IJ SOLN: 4.0000 mg | Freq: Four times a day (QID) | INTRAMUSCULAR | Status: DC | PRN

## 2016-08-31 MED ORDER — SENNOSIDES-DOCUSATE SODIUM 8.6-50 MG PO TABS
1.0000 | ORAL_TABLET | Freq: Two times a day (BID) | ORAL | Status: DC
  Administered 2016-08-31 – 2016-09-02 (×5): 1 via ORAL
  Filled 2016-08-31 (×5): qty 1

## 2016-08-31 MED ORDER — PHENOL 1.4 % MT LIQD: 1.0000 | OROMUCOSAL | Status: DC | PRN

## 2016-08-31 MED ORDER — SODIUM CHLORIDE 0.9 % IV SOLN
INTRAVENOUS | Status: DC | PRN
  Administered 2016-08-31: 60 mL

## 2016-08-31 MED ORDER — BUPIVACAINE HCL (PF) 0.5 % IJ SOLN
INTRAMUSCULAR | Status: DC | PRN
  Administered 2016-08-31: 3 mL

## 2016-08-31 MED ORDER — CHLORHEXIDINE GLUCONATE 4 % EX LIQD: 60.0000 mL | Freq: Once | CUTANEOUS | Status: DC

## 2016-08-31 MED ORDER — PROPOFOL 500 MG/50ML IV EMUL
INTRAVENOUS | Status: DC | PRN
  Administered 2016-08-31: 60 ug/kg/min via INTRAVENOUS

## 2016-08-31 MED ORDER — FLEET ENEMA 7-19 GM/118ML RE ENEM: 1.0000 | ENEMA | Freq: Once | RECTAL | Status: DC | PRN

## 2016-08-31 MED ORDER — LISINOPRIL 5 MG PO TABS
5.0000 mg | ORAL_TABLET | Freq: Every day | ORAL | Status: DC
  Administered 2016-08-31 – 2016-09-02 (×3): 5 mg via ORAL
  Filled 2016-08-31 (×3): qty 1

## 2016-08-31 MED ORDER — SODIUM CHLORIDE 0.9 % IV SOLN
INTRAVENOUS | Status: DC
  Administered 2016-08-31 – 2016-09-01 (×2): via INTRAVENOUS

## 2016-08-31 MED ORDER — BUPIVACAINE HCL (PF) 0.25 % IJ SOLN
INTRAMUSCULAR | Status: DC | PRN
  Administered 2016-08-31: 60 mL

## 2016-08-31 MED ORDER — ALUM & MAG HYDROXIDE-SIMETH 200-200-20 MG/5ML PO SUSP: 30.0000 mL | ORAL | Status: DC | PRN

## 2016-08-31 MED ORDER — MIDAZOLAM HCL 2 MG/2ML IJ SOLN
INTRAMUSCULAR | Status: DC | PRN
  Administered 2016-08-31: 2 mg via INTRAVENOUS

## 2016-08-31 MED ORDER — LIDOCAINE HCL (PF) 1 % IJ SOLN
INTRAMUSCULAR | Status: DC | PRN
  Administered 2016-08-31: .8 mL via SUBCUTANEOUS

## 2016-08-31 MED ORDER — MORPHINE SULFATE (PF) 2 MG/ML IV SOLN: 2.0000 mg | INTRAVENOUS | Status: DC | PRN

## 2016-08-31 MED ORDER — BUPIVACAINE HCL (PF) 0.25 % IJ SOLN
INTRAMUSCULAR | Status: AC
  Filled 2016-08-31: qty 60

## 2016-08-31 MED ORDER — PHENYLEPHRINE HCL 10 MG/ML IJ SOLN
INTRAMUSCULAR | Status: DC | PRN
Start: 2016-08-31 — End: 2016-08-31
  Administered 2016-08-31: 15 ug/min via INTRAVENOUS
  Administered 2016-08-31: 25 ug/min via INTRAVENOUS

## 2016-08-31 MED ORDER — ONDANSETRON HCL 4 MG PO TABS: 4.0000 mg | ORAL_TABLET | Freq: Four times a day (QID) | ORAL | Status: DC | PRN

## 2016-08-31 MED ORDER — SODIUM CHLORIDE 0.9 % IV SOLN
INTRAVENOUS | Status: DC | PRN
  Administered 2016-08-31: 6 ug/kg/min via INTRAVENOUS

## 2016-08-31 MED ORDER — FENTANYL CITRATE (PF) 100 MCG/2ML IJ SOLN: 25.0000 ug | INTRAMUSCULAR | Status: DC | PRN

## 2016-08-31 MED ORDER — ACETAMINOPHEN 10 MG/ML IV SOLN
INTRAVENOUS | Status: AC
  Filled 2016-08-31: qty 100

## 2016-08-31 MED ORDER — INSULIN ASPART 100 UNIT/ML ~~LOC~~ SOLN
0.0000 [IU] | Freq: Three times a day (TID) | SUBCUTANEOUS | Status: DC
  Administered 2016-09-01: 2 [IU] via SUBCUTANEOUS
  Filled 2016-08-31: qty 2

## 2016-08-31 SURGICAL SUPPLY — 66 items
AUTOTRANSFUS HAS 1/8 (MISCELLANEOUS) ×3
BATTERY INSTRU NAVIGATION (MISCELLANEOUS) ×12 IMPLANT
BLADE SAW 1 (BLADE) ×3 IMPLANT
BLADE SAW 1/2 (BLADE) ×3 IMPLANT
BLADE SAW SGTL 13X75X1.27 (BLADE) ×2 IMPLANT
BONE CEMENT GENTAMICIN (Cement) ×6 IMPLANT
BTRY SRG DRVR LF (MISCELLANEOUS) ×4
CANISTER SUCT 1200ML W/VALVE (MISCELLANEOUS) ×3 IMPLANT
CANISTER SUCT 3000ML (MISCELLANEOUS) ×6 IMPLANT
CAPT KNEE TOTAL 3 ATTUNE ×2 IMPLANT
CATH FOLEY SIL 2WAY 14FR5CC (CATHETERS) ×2 IMPLANT
CATH TRAY METER 16FR LF (MISCELLANEOUS) ×3 IMPLANT
CEMENT BONE GENTAMICIN 40 (Cement) IMPLANT
COOLER POLAR GLACIER W/PUMP (MISCELLANEOUS) ×3 IMPLANT
CUFF TOURN 24 STER (MISCELLANEOUS) IMPLANT
CUFF TOURN 30 STER DUAL PORT (MISCELLANEOUS) IMPLANT
DRAPE SHEET LG 3/4 BI-LAMINATE (DRAPES) ×3 IMPLANT
DRSG DERMACEA 8X12 NADH (GAUZE/BANDAGES/DRESSINGS) ×3 IMPLANT
DRSG OPSITE POSTOP 4X14 (GAUZE/BANDAGES/DRESSINGS) ×3 IMPLANT
DRSG TEGADERM 4X4.75 (GAUZE/BANDAGES/DRESSINGS) ×3 IMPLANT
DURAPREP 26ML APPLICATOR (WOUND CARE) ×6 IMPLANT
ELECT CAUTERY BLADE 6.4 (BLADE) ×3 IMPLANT
ELECT REM PT RETURN 9FT ADLT (ELECTROSURGICAL) ×3
ELECTRODE REM PT RTRN 9FT ADLT (ELECTROSURGICAL) ×1 IMPLANT
EX-PIN ORTHOLOCK NAV 4X150 (PIN) ×6 IMPLANT
GLOVE BIOGEL M STRL SZ7.5 (GLOVE) ×6 IMPLANT
GLOVE INDICATOR 8.0 STRL GRN (GLOVE) ×3 IMPLANT
GLOVE SURG 9.0 ORTHO LTXF (GLOVE) ×3 IMPLANT
GLOVE SURG ORTHO 9.0 STRL STRW (GLOVE) ×3 IMPLANT
GOWN STRL REUS W/ TWL LRG LVL3 (GOWN DISPOSABLE) ×2 IMPLANT
GOWN STRL REUS W/TWL 2XL LVL3 (GOWN DISPOSABLE) ×3 IMPLANT
GOWN STRL REUS W/TWL LRG LVL3 (GOWN DISPOSABLE) ×6
HANDPIECE INTERPULSE COAX TIP (DISPOSABLE) ×3
HOLDER FOLEY CATH W/STRAP (MISCELLANEOUS) ×3 IMPLANT
HOOD PEEL AWAY FLYTE STAYCOOL (MISCELLANEOUS) ×6 IMPLANT
KIT RM TURNOVER STRD PROC AR (KITS) ×3 IMPLANT
KNIFE SCULPS 14X20 (INSTRUMENTS) ×3 IMPLANT
LABEL OR SOLS (LABEL) ×3 IMPLANT
NDL SAFETY 18GX1.5 (NEEDLE) ×3 IMPLANT
NDL SPNL 20GX3.5 QUINCKE YW (NEEDLE) ×1 IMPLANT
NEEDLE SPNL 20GX3.5 QUINCKE YW (NEEDLE) ×3 IMPLANT
NS IRRIG 500ML POUR BTL (IV SOLUTION) ×3 IMPLANT
PACK TOTAL KNEE (MISCELLANEOUS) ×3 IMPLANT
PAD WRAPON POLAR KNEE (MISCELLANEOUS) ×1 IMPLANT
PIN DRILL QUICK PACK ×3 IMPLANT
PIN FIXATION 1/8DIA X 3INL (PIN) ×3 IMPLANT
SAGITTAL BLADE IMPLANT
SET HNDPC FAN SPRY TIP SCT (DISPOSABLE) ×1 IMPLANT
SOL .9 NS 3000ML IRR  AL (IV SOLUTION) ×2
SOL .9 NS 3000ML IRR AL (IV SOLUTION) ×1
SOL .9 NS 3000ML IRR UROMATIC (IV SOLUTION) ×1 IMPLANT
SOL PREP PVP 2OZ (MISCELLANEOUS) ×3
SOLUTION PREP PVP 2OZ (MISCELLANEOUS) ×1 IMPLANT
SPONGE DRAIN TRACH 4X4 STRL 2S (GAUZE/BANDAGES/DRESSINGS) ×3 IMPLANT
STAPLER SKIN PROX 35W (STAPLE) ×3 IMPLANT
SUCTION FRAZIER HANDLE 10FR (MISCELLANEOUS) ×2
SUCTION TUBE FRAZIER 10FR DISP (MISCELLANEOUS) ×1 IMPLANT
SUT VIC AB 0 CT1 36 (SUTURE) ×3 IMPLANT
SUT VIC AB 1 CT1 36 (SUTURE) ×6 IMPLANT
SUT VIC AB 2-0 CT2 27 (SUTURE) ×3 IMPLANT
SYR 20CC LL (SYRINGE) ×3 IMPLANT
SYR 30ML LL (SYRINGE) ×6 IMPLANT
SYSTEM AUTOTRANSFUS DUAL TROCR (MISCELLANEOUS) ×1 IMPLANT
TOWEL OR 17X26 4PK STRL BLUE (TOWEL DISPOSABLE) ×3 IMPLANT
TOWER CARTRIDGE SMART MIX (DISPOSABLE) ×3 IMPLANT
WRAPON POLAR PAD KNEE (MISCELLANEOUS) ×3

## 2016-08-31 NOTE — Anesthesia Procedure Notes (Signed)
Spinal  Patient location during procedure: OR Start time: 08/31/2016 7:32 AM End time: 08/31/2016 7:43 AM Staffing Performed: anesthesiologist  Preanesthetic Checklist Completed: patient identified, site marked, surgical consent, pre-op evaluation, timeout performed, IV checked, risks and benefits discussed and monitors and equipment checked Spinal Block Patient position: sitting Prep: Betadine Patient monitoring: heart rate, continuous pulse ox, blood pressure and cardiac monitor Approach: midline Location: L4-5 Injection technique: single-shot Needle Needle type: Whitacre and Introducer  Needle gauge: 24 G Needle length: 9 cm Needle insertion depth: 9 cm Additional Notes Negative paresthesia. Negative blood return. Positive free-flowing CSF. Expiration date of kit checked and confirmed. Patient tolerated procedure well, without complications.

## 2016-08-31 NOTE — Progress Notes (Signed)
Patient was admitted to room 147 from surgery via room bed and surgery staff. Autovac, foley, NSL, dressing are all in place from OR.  Bed alarm on for safety. Family at bedside. Patient is sleepy, but able to answer admit questions.

## 2016-08-31 NOTE — [place] FL2 (Signed)
[place] MEDICAID FL2 LEVEL OF CARE SCREENING TOOL     IDENTIFICATION  Patient Name: Michele Jefferson Birthdate: 01/02/1951 Sex: female Admission Date (Current Location): 08/31/2016  [place]   Facility and Address:  [facility], [address], [place], [place] [address]      Provider Number: [id number]  Attending Physician Name and Address:  Donato HeinzJames P Hooten, MD  Relative Name and Phone Number:       Current Level of Care: Hospital Recommended Level of Care: Skilled Nursing Facility Prior Approval Number:    Date Approved/Denied:   PASRR Number:  [id number])  Discharge Plan: SNF    Current Diagnoses: Patient Active Problem List   Diagnosis Date Noted  . Gastric ulcer 08/31/2016  . Hypertension 08/31/2016  . Sinus bradycardia 08/31/2016  . S/P total knee arthroplasty 08/31/2016  . Mild intermittent asthma without complication 11/15/2015  . GERD without esophagitis 03/26/2015  . Type 2 diabetes mellitus without complication (HCC) 03/26/2015  . DDD (degenerative disc disease), lumbar 10/11/2014  . Lumbar radiculitis 10/11/2014  . Primary osteoarthritis of both knees 10/11/2014  . DJD (degenerative joint disease) 02/13/2014    Orientation RESPIRATION BLADDER Height & Weight     Self, Time, Situation, Place  O2 (2 Liters Oxygen ) Continent Weight: 155 lb (70.3 kg) Height:  5' 1.5" (156.2 cm)  BEHAVIORAL SYMPTOMS/MOOD NEUROLOGICAL BOWEL NUTRITION STATUS   (none)  (none) Continent Diet (Regular Diet )  AMBULATORY STATUS COMMUNICATION OF NEEDS Skin   Extensive Assist Verbally Surgical wounds (Incision: Right Knee )                       Personal Care Assistance Level of Assistance  Bathing, Feeding, Dressing Bathing Assistance: Limited assistance Feeding assistance: Independent Dressing Assistance: Limited assistance     Functional Limitations Info  Sight, Hearing, Speech Sight Info:  Adequate Hearing Info: Adequate Speech Info: Adequate    SPECIAL CARE FACTORS FREQUENCY  PT (By licensed PT), OT (By licensed OT)     PT Frequency:  (5) OT Frequency:  (5)            Contractures      Additional Factors Info  Code Status, Allergies, Insulin Sliding Scale Code Status Info:  (Full Code. ) Allergies Info:  (Fluticasone-salmeterol, Celecoxib, Meloxicam, Oxycodone- acetaminophen, Cortisone, Tramadol, Doxycycline, Rofecoxib)   Insulin Sliding Scale Info:  (NovoLog Insulin Injections )       Current Medications (08/31/2016):  This is the current hospital active medication list Current Facility-Administered Medications  Medication Dose Route Frequency Provider Last Rate Last Dose  . 0.9 %  sodium chloride infusion   Intravenous Continuous Donato HeinzJames P Hooten, MD      . acetaminophen (OFIRMEV) IV 1,000 mg  1,000 mg Intravenous Q6H Donato HeinzJames P Hooten, MD      . acetaminophen (TYLENOL) tablet 650 mg  650 mg Oral Q6H PRN Donato HeinzJames P Hooten, MD       Or  . acetaminophen (TYLENOL) suppository 650 mg  650 mg Rectal Q6H PRN Donato HeinzJames P Hooten, MD      . albuterol (PROVENTIL HFA;VENTOLIN HFA) 108 (90 Base) MCG/ACT inhaler 1-2 puff  1-2 puff Inhalation Q6H PRN Donato HeinzJames P Hooten, MD      . alum & mag hydroxide-simeth (MAALOX/MYLANTA) 200-200-20 MG/5ML suspension 30 mL  30 mL Oral Q4H PRN Donato HeinzJames P Hooten, MD      . bisacodyl (DULCOLAX) suppository 10 mg  10 mg Rectal  Daily PRN Donato HeinzJames P Hooten, MD      . ceFAZolin (ANCEF) IVPB 2g/100 mL premix  2 g Intravenous Q6H Donato HeinzJames P Hooten, MD      . diphenhydrAMINE (BENADRYL) 12.5 MG/5ML elixir 12.5-25 mg  12.5-25 mg Oral Q4H PRN Donato HeinzJames P Hooten, MD      . dorzolamide (TRUSOPT) 2 % ophthalmic solution 1 drop  1 drop Both Eyes BID Donato HeinzJames P Hooten, MD      . Melene Muller[START ON 09/01/2016] enoxaparin (LOVENOX) injection 30 mg  30 mg Subcutaneous Q12H Donato HeinzJames P Hooten, MD      . famotidine (PEPCID) 20 MG tablet           . ferrous sulfate tablet 325 mg  325 mg Oral BID WC  Donato HeinzJames P Hooten, MD      . insulin aspart (novoLOG) injection 0-15 Units  0-15 Units Subcutaneous TID WC Donato HeinzJames P Hooten, MD      . lisinopril (PRINIVIL,ZESTRIL) tablet 5 mg  5 mg Oral Daily Donato HeinzJames P Hooten, MD      . magnesium hydroxide (MILK OF MAGNESIA) suspension 30 mL  30 mL Oral Daily PRN Donato HeinzJames P Hooten, MD      . menthol-cetylpyridinium (CEPACOL) lozenge 3 mg  1 lozenge Oral PRN Donato HeinzJames P Hooten, MD       Or  . phenol (CHLORASEPTIC) mouth spray 1 spray  1 spray Mouth/Throat PRN Donato HeinzJames P Hooten, MD      . metoCLOPramide (REGLAN) tablet 10 mg  10 mg Oral TID AC & HS Donato HeinzJames P Hooten, MD      . morphine 2 MG/ML injection 2 mg  2 mg Intravenous Q2H PRN Donato HeinzJames P Hooten, MD      . ondansetron (ZOFRAN) tablet 4 mg  4 mg Oral Q6H PRN Donato HeinzJames P Hooten, MD       Or  . ondansetron (ZOFRAN) injection 4 mg  4 mg Intravenous Q6H PRN Donato HeinzJames P Hooten, MD      . pantoprazole (PROTONIX) EC tablet 40 mg  40 mg Oral BID Donato HeinzJames P Hooten, MD      . senna-docusate (Senokot-S) tablet 1 tablet  1 tablet Oral BID Donato HeinzJames P Hooten, MD      . sodium phosphate (FLEET) 7-19 GM/118ML enema 1 enema  1 enema Rectal Once PRN Donato HeinzJames P Hooten, MD      . tapentadol (NUCYNTA) tablet 50-100 mg  50-100 mg Oral Q4H PRN Donato HeinzJames P Hooten, MD         Discharge Medications: Please see discharge summary for a list of discharge medications.  Relevant Imaging Results:  Relevant Lab Results:   Additional Information  (SSN: [id number])  Sample, Darleen CrockerBailey M, LCSW

## 2016-08-31 NOTE — Progress Notes (Signed)
Pt back in bed. Pt was able to tolerate sitting up in chair this evening without difficulty.

## 2016-08-31 NOTE — Progress Notes (Signed)
Patient's BP was 180/67. Home BP meds given without change. Pain is a 3/10 and tolerable. Notified MD. Michele GammaGave a one time order, see emar.

## 2016-08-31 NOTE — Anesthesia Preprocedure Evaluation (Addendum)
Anesthesia Evaluation  Patient identified by MRN, date of birth, ID band Patient awake    Reviewed: Allergy & Precautions, NPO status , Patient's Chart, lab work & pertinent test results  History of Anesthesia Complications Negative for: history of anesthetic complications  Airway Mallampati: II       Dental   Pulmonary asthma , former smoker,           Cardiovascular hypertension, Pt. on medications (-) Past MI and (-) CHF (-) dysrhythmias (-) Valvular Problems/Murmurs     Neuro/Psych neg Seizures    GI/Hepatic PUD, GERD  ,  Endo/Other  diabetes (borderline)  Renal/GU      Musculoskeletal   Abdominal   Peds  Hematology   Anesthesia Other Findings   Reproductive/Obstetrics                             Anesthesia Physical Anesthesia Plan  ASA: II  Anesthesia Plan: Spinal   Post-op Pain Management:    Induction: Intravenous  Airway Management Planned:   Additional Equipment:   Intra-op Plan:   Post-operative Plan:   Informed Consent: I have reviewed the patients History and Physical, chart, labs and discussed the procedure including the risks, benefits and alternatives for the proposed anesthesia with the patient or authorized representative who has indicated his/her understanding and acceptance.     Plan Discussed with:   Anesthesia Plan Comments:        Anesthesia Quick Evaluation

## 2016-08-31 NOTE — Transfer of Care (Signed)
Immediate Anesthesia Transfer of Care Note  Patient: Michele Jefferson  Procedure(s) Performed: Procedure(s): COMPUTER ASSISTED TOTAL KNEE ARTHROPLASTY (Right)  Patient Location: PACU  Anesthesia Type:Spinal  Level of Consciousness: sedated  Airway & Oxygen Therapy: Patient Spontanous Breathing and Patient connected to nasal cannula oxygen  Post-op Assessment: Report given to RN and Post -op Vital signs reviewed and stable  Post vital signs: Reviewed and stable  Last Vitals:  Vitals:   08/31/16 0611  BP: (!) 153/87  Pulse: 65  Resp: 18  Temp: 36.6 C    Last Pain:  Vitals:   08/31/16 0611  TempSrc: Oral  PainSc: 5          Complications: No apparent anesthesia complications

## 2016-08-31 NOTE — Op Note (Signed)
OPERATIVE NOTE  DATE OF SURGERY:  08/31/2016  PATIENT NAME:  Michele PlummerValerie M Jefferson   DOB: 08/06/1951  MRN: [id number]  PRE-OPERATIVE DIAGNOSIS: Degenerative arthrosis of the right knee, primary  POST-OPERATIVE DIAGNOSIS:  Same  PROCEDURE:  Right total knee arthroplasty using computer-assisted navigation  SURGEON:  Jena GaussJames P Daelin Haste, Jr. M.D.  ASSISTANT:  Dedra Skeensodd Mundy, PA-C (present and scrubbed throughout the case, critical for assistance with exposure, retraction, instrumentation, and closure)  ANESTHESIA: spinal  ESTIMATED BLOOD LOSS: 50 mL  FLUIDS REPLACED: 1300 mL of crystalloid  TOURNIQUET TIME: 104 minutes  DRAINS: 2 medium drains to a reinfusion system  SOFT TISSUE RELEASES: Anterior cruciate ligament, posterior cruciate ligament, deep medial collateral ligament, patellofemoral ligament  IMPLANTS UTILIZED: DePuy Attune size 4 posterior stabilized femoral component (cemented), size 4 rotating platform tibial component (cemented), 32 mm medialized dome patella (cemented), and a 5 mm stabilized rotating platform polyethylene insert.  INDICATIONS FOR SURGERY: Michele PlummerValerie M Shinall is a 65 y.o. year old female with a long history of progressive knee pain. X-rays demonstrated severe degenerative changes in tricompartmental fashion. The patient had not seen any significant improvement despite conservative nonsurgical intervention. After discussion of the risks and benefits of surgical intervention, the patient expressed understanding of the risks benefits and agree with plans for total knee arthroplasty.   The risks, benefits, and alternatives were discussed at length including but not limited to the risks of infection, bleeding, nerve injury, stiffness, blood clots, the need for revision surgery, cardiopulmonary complications, among others, and they were willing to proceed.  PROCEDURE IN DETAIL: The patient was brought into the operating room and, after adequate spinal anesthesia was achieved, a  tourniquet was placed on the patient's upper thigh. The patient's knee and leg were cleaned and prepped with alcohol and DuraPrep and draped in the usual sterile fashion. A "timeout" was performed as per usual protocol. The lower extremity was exsanguinated using an Esmarch, and the tourniquet was inflated to 300 mmHg. An anterior longitudinal incision was made followed by a standard mid vastus approach. The deep fibers of the medial collateral ligament were elevated in a subperiosteal fashion off of the medial flare of the tibia so as to maintain a continuous soft tissue sleeve. The patella was subluxed laterally and the patellofemoral ligament was incised. Inspection of the knee demonstrated severe degenerative changes with full-thickness loss of articular cartilage. Osteophytes were debrided using a rongeur. Anterior and posterior cruciate ligaments were excised. Two 4.0 mm Schanz pins were inserted in the femur and into the tibia for attachment of the array of trackers used for computer-assisted navigation. Hip center was identified using a circumduction technique. Distal landmarks were mapped using the computer. The distal femur and proximal tibia were mapped using the computer. The distal femoral cutting guide was positioned using computer-assisted navigation so as to achieve a 5 distal valgus cut. The femur was sized and it was felt that a size 4 femoral component was appropriate. A size 4 femoral cutting guide was positioned and the anterior cut was performed and verified using the computer. This was followed by completion of the posterior and chamfer cuts. Femoral cutting guide for the central box was then positioned in the center box cut was performed.  Attention was then directed to the proximal tibia. Medial and lateral menisci were excised. The extramedullary tibial cutting guide was positioned using computer-assisted navigation so as to achieve a 0 varus-valgus alignment and 3 posterior slope. The  cut was performed and verified using the computer.  The proximal tibia was sized and it was felt that a size 4 tibial tray was appropriate. Tibial and femoral trials were inserted followed by insertion of a 5 mm polyethylene insert. This allowed for excellent mediolateral soft tissue balancing both in flexion and in full extension. Finally, the patella was cut and prepared so as to accommodate a 5 mm medialized dome patella. A patella trial was placed and the knee was placed through a range of motion with excellent patellar tracking appreciated. The femoral trial was removed after debridement of posterior osteophytes. The central post-hole for the tibial component was reamed followed by insertion of a keel punch. Tibial trials were then removed. Cut surfaces of bone were irrigated with copious amounts of normal saline with antibiotic solution using pulsatile lavage and then suctioned dry. Polymethylmethacrylate cement with gentamicin was prepared in the usual fashion using a vacuum mixer. Cement was applied to the cut surface of the proximal tibia as well as along the undersurface of a size 4 rotating platform tibial component. Tibial component was positioned and impacted into place. Excess cement was removed using Personal assistantreer elevators. Cement was then applied to the cut surfaces of the femur as well as along the posterior flanges of the size 4 femoral component. The femoral component was positioned and impacted into place. Excess cement was removed using Personal assistantreer elevators. A 5 mm polyethylene trial was inserted and the knee was brought into full extension with steady axial compression applied. Finally, cement was applied to the backside of a 32 mm medialized dome patella and the patellar component was positioned and patellar clamp applied. Excess cement was removed using Personal assistantreer elevators. After adequate curing of the cement, the tourniquet was deflated after a total tourniquet time of 104 minutes. Hemostasis was achieved  using electrocautery. The knee was irrigated with copious amounts of normal saline with antibiotic solution using pulsatile lavage and then suctioned dry. 20 mL of 1.3% Exparel and 60 mL of 0.25% Marcaine in 40 mL of normal saline was injected along the posterior capsule, medial and lateral gutters, and along the arthrotomy site. A 5 mm stabilized rotating platform polyethylene insert was inserted and the knee was placed through a range of motion with excellent mediolateral soft tissue balancing appreciated and excellent patellar tracking noted. 2 medium drains were placed in the wound bed and brought out through separate stab incisions to be attached to a reinfusion system. The medial parapatellar portion of the incision was reapproximated using interrupted sutures of #1 Vicryl. Subcutaneous tissue was approximated in layers using first #0 Vicryl followed #2-0 Vicryl. The skin was approximated with skin staples. A sterile dressing was applied.  The patient tolerated the procedure well and was transported to the recovery room in stable condition.    Lucciano Vitali P. Angie FavaHooten, Jr., M.D.

## 2016-08-31 NOTE — H&P (Signed)
The patient has been re-examined, and the chart reviewed, and there have been no interval changes to the documented history and physical.    The risks, benefits, and alternatives have been discussed at length. The patient expressed understanding of the risks benefits and agreed with plans for surgical intervention.  Michele Jefferson, Jr. M.D.    

## 2016-08-31 NOTE — Progress Notes (Signed)
Heart rate in 5o,s at times no new orders from dr Henrene Hawkingkephart

## 2016-08-31 NOTE — Progress Notes (Signed)
Applied avi boots elevated feet off bed and applied polar care

## 2016-08-31 NOTE — Progress Notes (Signed)
PT Cancellation Note  Patient Details Name: Naaman PlummerValerie M Pio MRN: [id number] DOB: 05/19/1951   Cancelled Treatment:    Reason Eval/Treat Not Completed: Medical issues which prohibited therapy (Consult received and chart reviewed.  Patient without full sensory return post surgery; contraindicated for physical therapy evaluation at this time.  Will re-attempt next date as appropriate.    Encouraged patient to dangle and/or complete OOB to chair with nursing this PM as able.  Patient voiced understanding. Motivated to participate/progress as able.)   Edwyna Dangerfield H. Manson PasseyBrown, PT, DPT, NCS 08/31/16, 4:06 PM [phone]

## 2016-08-31 NOTE — Progress Notes (Signed)
Pt is up in bedside chair tolerated very well.

## 2016-08-31 NOTE — Brief Op Note (Signed)
08/31/2016  11:22 AM  PATIENT:  Michele Jefferson  65 y.o. female  PRE-OPERATIVE DIAGNOSIS:  PRIMARY OSTEOARTHRITIS RIGHT KNEE  POST-OPERATIVE DIAGNOSIS:  PRIMARY OSTEOARTHRITIS RIGHT KNEE  PROCEDURE:  Procedure(s): COMPUTER ASSISTED TOTAL KNEE ARTHROPLASTY (Right)  SURGEON:  Surgeon(s) and Role:    * Donato HeinzJames P Hooten, MD - Primary  ASSISTANTS: Dedra Skeensodd Mundy, PA-C   ANESTHESIA:   spinal  EBL:  Total I/O In: 1300 [I.V.:1300] Out: 600 [Urine:550; Blood:50]  BLOOD ADMINISTERED:none  DRAINS: 2 medium drains to a reinfusion system   LOCAL MEDICATIONS USED:  MARCAINE    and OTHER Exparel  SPECIMEN:  No Specimen  DISPOSITION OF SPECIMEN:  N/A  COUNTS:  YES  TOURNIQUET:   104 minutes  DICTATION: .Dragon Dictation  PLAN OF CARE: Admit to inpatient   PATIENT DISPOSITION:  PACU - hemodynamically stable.   Delay start of Pharmacological VTE agent (>24hrs) due to surgical blood loss or risk of bleeding: yes

## 2016-09-01 ENCOUNTER — Encounter: Payer: Self-pay | Admitting: Orthopedic Surgery

## 2016-09-01 LAB — BASIC METABOLIC PANEL
ANION GAP: 5 (ref 5–15)
BUN: 11 mg/dL (ref 6–20)
CHLORIDE: 106 mmol/L (ref 101–111)
CO2: 25 mmol/L (ref 22–32)
CREATININE: 0.8 mg/dL (ref 0.44–1.00)
Calcium: 8.4 mg/dL — ABNORMAL LOW (ref 8.9–10.3)
GFR calc non Af Amer: >60 mL/min (ref >60)
Glucose, Bld: 112 mg/dL — ABNORMAL HIGH (ref 65–99)
Potassium: 4.1 mmol/L (ref 3.5–5.1)
SODIUM: 136 mmol/L (ref 135–145)

## 2016-09-01 LAB — GLUCOSE, CAPILLARY
GLUCOSE-CAPILLARY: 131 mg/dL — AB (ref 65–99)
Glucose-Capillary: 92 mg/dL (ref 65–99)
Glucose-Capillary: 139 mg/dL — ABNORMAL HIGH (ref 65–99)
Glucose-Capillary: 102 mg/dL — ABNORMAL HIGH (ref 65–99)

## 2016-09-01 LAB — CBC
HEMATOCRIT: 35.7 % (ref 35.0–47.0)
HEMOGLOBIN: 11.9 g/dL — AB (ref 12.0–16.0)
MCH: 27 pg (ref 26.0–34.0)
MCHC: 33.2 g/dL (ref 32.0–36.0)
MCV: 81.3 fL (ref 80.0–100.0)
Platelets: 267 10*3/uL (ref 150–440)
RBC: 4.39 MIL/uL (ref 3.80–5.20)
RDW: 15.1 % — ABNORMAL HIGH (ref 11.5–14.5)
WBC: 7.8 10*3/uL (ref 3.6–11.0)

## 2016-09-01 MED ORDER — HYDROCODONE-ACETAMINOPHEN 5-325 MG PO TABS
1.0000 | ORAL_TABLET | ORAL | Status: DC | PRN
  Administered 2016-09-01: 2 via ORAL
  Filled 2016-09-01: qty 2

## 2016-09-01 NOTE — Progress Notes (Addendum)
   Subjective: 1 Day Post-Op Procedure(s) (LRB): COMPUTER ASSISTED TOTAL KNEE ARTHROPLASTY (Right) Patient reports pain as mild.   Patient seen in rounds with Dr. Ernest PineHooten. Dr. Ernest PineHooten was in the room with the patient. Patient is well, and has had no acute complaints or problems Plan is to go Home after hospital stay. Negative for chest pain and shortness of breath Fever: no Gastrointestinal: Negative for nausea and vomiting. The patient has urinated independently this morning.  Objective: Vital signs in last 24 hours: Temp:  [96.5 F (35.8 C)-98 F (36.7 C)] 97.8 F (36.6 C) (12/19 0356) Pulse Rate:  [53-76] 64 (12/19 0356) Resp:  [9-21] 16 (12/19 0356) BP: (100-180)/(56-79) 120/62 (12/19 0401) SpO2:  [94 %-100 %] 94 % (12/19 0356) FiO2 (%):  [28 %] 28 % (12/18 1325)  Intake/Output from previous day:  Intake/Output Summary (Last 24 hours) at 09/01/16 0653 Last data filed at 09/01/16 0545  Gross per 24 hour  Intake          3341.66 ml  Output             1650 ml  Net          1691.66 ml    Intake/Output this shift: Total I/O In: 1621.7 [I.V.:1321.7; IV Piggyback:300] Out: 470 [Urine:350; Drains:120]  Labs:  Recent Labs  08/31/16 0700 09/01/16 0423  HGB 14.3 11.9*    Recent Labs  08/31/16 0700 09/01/16 0423  WBC  --  7.8  RBC  --  4.39  HCT 42.0 35.7  PLT  --  267    Recent Labs  08/31/16 0700 09/01/16 0423  NA 141 136  K 4.4 4.1  CL  --  106  CO2  --  25  BUN  --  11  CREATININE  --  0.80  GLUCOSE 102* 112*  CALCIUM  --  8.4*   No results for input(s): LABPT, INR in the last 72 hours.   EXAM General - Patient is Alert and Oriented. Lungs are clear to auscultation. Extremity - Sensation intact distally Dorsiflexion/Plantar flexion intact Compartment soft Dressing/Incision - clean, dry, no drainage, Hemovac is intact. Motor Function - intact, moving foot and toes well on exam. The patient can independently do a straight leg raise.  Past  Medical History:  Diagnosis Date  . Arthritis    generalized  . Asthma   . GERD (gastroesophageal reflux disease)   . Headache    hx migraines  . Hypertension   . 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 Pre-diabetes     Assessment/Plan: 1 Day Post-Op Procedure(s) (LRB): COMPUTER ASSISTED TOTAL KNEE ARTHROPLASTY (Right) Active Problems:   S/P total knee arthroplasty  Estimated body mass index is 28.81 kg/m as calculated from the following:   Height as of this encounter: 5' 1.5" (1.562 m).   Weight as of this encounter: 70.3 kg (155 lb). Advance diet Up with therapy D/C IV fluids Plan for discharge tomorrow with home health physical therapy  DVT Prophylaxis - Lovenox, Foot Pumps and TED hose Weight-Bearing as tolerated to right leg  Dedra Skeensodd Latonya Nelon, PA-C Orthopaedic Surgery 09/01/2016, 6:53 AM

## 2016-09-01 NOTE — Evaluation (Signed)
Physical Therapy Evaluation Patient Details Name: Michele Jefferson MRN: [id number] DOB: 05/12/1951 Today's Date: 09/01/2016   History of Present Illness  Pt. is a 65 y.o. female who was [facility] for a right TKR.  Clinical Impression  Pt is s/p R TKA resulting in the deficits listed below (see PT Problem List). Ms. Michele Jefferson was Ind PTA using her cane recently due to pain, and lives with her daughter who is away at work during the day.  She ambulated 250 ft at min guard level of assist for safety due to pt's general lethargy throughout session although alert with exercises and ambulation.  She tolerated all interventions well this date.   Pt will benefit from skilled PT to increase their independence and safety with mobility to allow discharge to the venue listed below.     Follow Up Recommendations Home health PT    Equipment Recommendations  Rolling walker with 5" wheels    Recommendations for Other Services OT consult     Precautions / Restrictions Precautions Precautions: Fall;Knee Precaution Booklet Issued: No Precaution Comments: Instructed pt in no pillow under knee Required Braces or Orthoses: Knee Immobilizer - Right Knee Immobilizer - Right: Discontinue once straight leg raise with < 10 degree lag Restrictions Weight Bearing Restrictions: Yes RLE Weight Bearing: Weight bearing as tolerated      Mobility  Bed Mobility               General bed mobility comments: Pt sitting in recliner chair upon PT arrival  Transfers Overall transfer level: Needs assistance Equipment used: Rolling walker (2 wheeled) Transfers: Sit to/from Stand Sit to Stand: Min guard         General transfer comment: Cues for hand placement and technique.  Min guard for safety as pt demontrates mild instability  Ambulation/Gait Ambulation/Gait assistance: Min guard Ambulation Distance (Feet): 250 Feet Assistive device: Rolling walker (2 wheeled) Gait Pattern/deviations:  Step-through pattern;Decreased stance time - right;Decreased step length - left;Decreased stride length;Decreased weight shift to right;Antalgic;Trunk flexed Gait velocity: decreased Gait velocity interpretation: Below normal speed for age/gender General Gait Details: Cues for upright posture and forward gaze which pt has difficulty maintaining and pt reports "I've gotten used to bending over".  Min guard for safety as pt lethargic although more alert while ambulating.  Stairs            Wheelchair Mobility    Modified Rankin (Stroke Patients Only)       Balance Overall balance assessment: Needs assistance Sitting-balance support: No upper extremity supported;Feet supported Sitting balance-Leahy Scale: Good     Standing balance support: Bilateral upper extremity supported;During functional activity Standing balance-Leahy Scale: Poor Standing balance comment: Relies on support from RW to remain steady                             Pertinent Vitals/Pain Pain Assessment: 0-10 Pain Score: 2  Pain Intervention(s): Limited activity within patient's tolerance;Monitored during session;Repositioned    Home Living Family/patient expects to be discharged to:: Private residence Living Arrangements: Children Available Help at Discharge: Family;Available PRN/intermittently Type of Home: House Home Access: Stairs to enter Entrance Stairs-Rails: Right;Left;Can reach both Entrance Stairs-Number of Steps: 5 Home Layout: One level Home Equipment: Cane - quad;Cane - single point      Prior Function Level of Independence: Independent with assistive device(s)         Comments: Pt started using her cane ~2 wks ago.  Pt reports she was using it "just about all of the time" but daughter says "she barely used it".  Pt is retired.  Pt ind with ADLs and still driving.     Hand Dominance   Dominant Hand: Right    Extremity/Trunk Assessment   Upper Extremity  Assessment Upper Extremity Assessment: Overall WFL for tasks assessed    Lower Extremity Assessment Lower Extremity Assessment: RLE deficits/detail RLE Deficits / Details: limited ROM and strength as expected s/p R TKA    Cervical / Trunk Assessment Cervical / Trunk Assessment: Normal  Communication   Communication: No difficulties  Cognition Arousal/Alertness: Lethargic Behavior During Therapy: WFL for tasks assessed/performed Overall Cognitive Status: Within Functional Limits for tasks assessed                      General Comments      Exercises Total Joint Exercises Ankle Circles/Pumps: AROM;Both;10 reps;Seated Quad Sets: Strengthening;Both;10 reps;Seated Gluteal Sets: Strengthening;Both;10 reps;Seated Straight Leg Raises: Strengthening;Right;10 reps;Seated Long Arc Quad: Strengthening;Right;10 reps;Seated Knee Flexion: AAROM;Right;5 reps;Seated Goniometric ROM: 94 R knee Flexion with AAROM Other Exercises Other Exercises: Standing weight shifting forward onto LLE and back onto RLE to promote involuntary contraction of RLE musculature   Assessment/Plan    PT Assessment Patient needs continued PT services  PT Problem List Decreased strength;Decreased range of motion;Decreased activity tolerance;Decreased mobility;Decreased balance;Decreased knowledge of use of DME;Decreased safety awareness;Decreased knowledge of precautions;Pain          PT Treatment Interventions DME instruction;Gait training;Stair training;Functional mobility training;Therapeutic exercise;Balance training;Therapeutic activities;Neuromuscular re-education;Patient/family education;Modalities;Manual techniques    PT Goals (Current goals can be found in the Care Plan section)  Acute Rehab PT Goals Patient Stated Goal: to go home and get back to baking PT Goal Formulation: With patient Time For Goal Achievement: 09/08/16 Potential to Achieve Goals: Good    Frequency BID   Barriers to  discharge        Co-evaluation               End of Session Equipment Utilized During Treatment: Gait belt Activity Tolerance: Patient tolerated treatment well Patient left: in chair;with call bell/phone within reach;with chair alarm set Nurse Communication: Mobility status;Other (comment) (pt's lethargy)         Time: [id number] PT Time Calculation (min) (ACUTE ONLY): 35 min   Charges:   PT Evaluation $PT Eval Low Complexity: 1 Procedure PT Treatments $Therapeutic Exercise: 8-22 mins   PT G Codes:        Encarnacion ChuAshley Edwards Mckelvie PT, DPT 09/01/2016, 1:23 PM

## 2016-09-01 NOTE — Progress Notes (Signed)
Pt remains alert and oriented. Pain control with oral medications. Tolerated up to chair without difficulty. Foley patent and draining urine. Iv infusing without difficulty. Pt able to sleep in between care.

## 2016-09-01 NOTE — Discharge Instructions (Signed)

## 2016-09-01 NOTE — Progress Notes (Signed)
Clinical Social Worker (CSW) received SNF consult. PT is recommending home health. RN case manager aware of above. Please reconsult if future social work needs arise. CSW signing off.   Holman Bonsignore, LCSW [phone] 

## 2016-09-01 NOTE — Evaluation (Signed)
Occupational Therapy Evaluation Patient Details Name: Michele PlummerValerie M Jefferson MRN: [id number] DOB: 09/13/1951 Today's Date: 09/01/2016    History of Present Illness Pt. is a 65 y.o. female who was [facility] right TKR.   Clinical Impression   Pt. Is a 65 y.o. female who was admitted for a right TKR. Pt presents with limited ROM, pain, weakness, lethargy/fatigue, and impaired functional mobility which hinder his/her ability to complete ADL and IADL tasks. Pt. could benefit from skilled OT services to review A/E use for LE ADLs, to review necessary home modifications, and to improve functional mobility for ADL/IADLs in order to work towards regaining Independence with ADL/IADLs.     Follow Up Recommendations  No OT follow up    Equipment Recommendations       Recommendations for Other Services Rehab consult     Precautions / Restrictions Restrictions Weight Bearing Restrictions: Yes RLE Weight Bearing: Weight bearing as tolerated                                                     ADL Overall ADL's : Needs assistance/impaired Eating/Feeding: Set up   Grooming: Set up               Lower Body Dressing: Moderate assistance                 General ADL Comments: Pt. education was provided about A/E use for LE ADLs.     Vision     Perception     Praxis      Pertinent Vitals/Pain Pain Assessment: 0-10 Pain Score: 2  Pain Intervention(s): Limited activity within patient's tolerance;Monitored during session;Repositioned     Hand Dominance Right   Extremity/Trunk Assessment Upper Extremity Assessment Upper Extremity Assessment: Overall WFL for tasks assessed           Communication Communication Communication: No difficulties   Cognition Arousal/Alertness: Lethargic   Overall Cognitive Status: Within Functional Limits for tasks assessed                     General Comments       Exercises        Shoulder Instructions      Home Living Family/patient expects to be discharged to:: Private residence Living Arrangements: Children Available Help at Discharge: Family;Available PRN/intermittently Type of Home: House Home Access: Stairs to enter Enter[employer] CorporationEntrance Stairs-Number of Steps: 5 Entrance Stairs-Rails: Right;Left;Can reach both Home Layout: One level     Bathroom Shower/Tub: Tub/shower unit;Curtain Shower/tub characteristics: Engineer, building servicesCurtain Bathroom Toilet: Standard Bathroom Accessibility: Yes   Home Equipment: Cane - quad;Cane - single point          Prior Functioning/Environment Level of Independence: Independent with assistive device(s)        Comments: Pt started using her cane ~2 wks ago.  Pt reports she was using it "just about all of the time" but daughter says "she barely used it".  Pt is retired.  Pt ind with ADLs and still driving.        OT Problem List: Decreased strength;Pain;Decreased knowledge of use of DME or AE   OT Treatment/Interventions: Self-care/ADL training;Therapeutic exercise;Therapeutic activities;DME and/or AE instruction;Patient/family education    OT Goals(Current goals can be found in the care plan section) ADL Goals Pt Will Perform Lower Body Dressing: with  modified independence Pt Will Transfer to Toilet: with modified independence  OT Frequency: Min 1X/week   Barriers to D/C:            Co-evaluation              End of Session    Activity Tolerance: Patient limited by lethargy Patient left: in chair;with call bell/phone within reach;with chair alarm set   Time: 1015-1030 OT Time Calculation (min): 15 min Charges:  OT General Charges $OT Visit: 1 Procedure OT Evaluation $OT Eval Moderate Complexity: 1 Procedure G-Codes:    Michele MessierElaine Peace Jost, MS, OTR/L 09/01/2016, 11:12 AM

## 2016-09-01 NOTE — Progress Notes (Signed)
Physical Therapy Treatment Patient Details Name: Michele PlummerValerie M Jefferson MRN: [id number] DOB: 03/08/1951 Today's Date: 09/01/2016    History of Present Illness Pt is a 65 y/o F s/p R TKA.      PT Comments    Michele Jefferson is making excellent progress with therapy.  She ambulated 450 ft and demonstrated improvements in gait mechanics with emphasis on heel strike and follow through.  She tolerated all interventions well this date.  Will need to complete stair training prior to d/c.  Pt will benefit from continued skilled PT services to increase functional independence and safety.   Follow Up Recommendations  Home health PT     Equipment Recommendations  Rolling walker with 5" wheels    Recommendations for Other Services       Precautions / Restrictions Precautions Precautions: Fall;Knee Precaution Booklet Issued: No Precaution Comments: Instructed pt in no pillow under knee Required Braces or Orthoses: Knee Immobilizer - Right Knee Immobilizer - Right: Discontinue once straight leg raise with < 10 degree lag Restrictions Weight Bearing Restrictions: Yes RLE Weight Bearing: Weight bearing as tolerated    Mobility  Bed Mobility Overal bed mobility: Modified Independent             General bed mobility comments: Pt answering and talking on cell phone while transitioning sit>supine with ease.  No cues or assist needed.   Transfers Overall transfer level: Needs assistance Equipment used: Rolling walker (2 wheeled) Transfers: Sit to/from Stand Sit to Stand: Supervision         General transfer comment: Pt with proper hand placement and technique.  Supervision for safety.  Ambulation/Gait Ambulation/Gait assistance: Supervision Ambulation Distance (Feet): 450 Feet Assistive device: Rolling walker (2 wheeled) Gait Pattern/deviations: Step-through pattern;Decreased stance time - right;Decreased step length - left;Decreased stride length;Decreased weight shift to  right;Antalgic;Trunk flexed Gait velocity: decreased Gait velocity interpretation: Below normal speed for age/gender General Gait Details: Pt requires repeated cues for upright posture and forward gaze.  Demonstrated proper heel strike and follow through which pt then demonstrated back for second lap around nurses station.  Cues to relax shoulders and UEs.  Suprevision for safety.   Stairs            Wheelchair Mobility    Modified Rankin (Stroke Patients Only)       Balance Overall balance assessment: Needs assistance Sitting-balance support: No upper extremity supported;Feet supported Sitting balance-Leahy Scale: Good     Standing balance support: Bilateral upper extremity supported;During functional activity Standing balance-Leahy Scale: Fair Standing balance comment: Pt able to stand statically without UE support but requires RW for dynamic activities                    Cognition Arousal/Alertness: Awake/alert Behavior During Therapy: WFL for tasks assessed/performed Overall Cognitive Status: Within Functional Limits for tasks assessed                      Exercises Total Joint Exercises Ankle Circles/Pumps: AROM;Both;10 reps;Seated Quad Sets: Strengthening;Both;10 reps;Seated Gluteal Sets: Strengthening;Both;10 reps;Seated Hip ABduction/ADduction: Strengthening;Right;10 reps;Seated Straight Leg Raises: Strengthening;Right;10 reps;Seated Long Arc Quad: Strengthening;Right;10 reps;Seated Knee Flexion: AAROM;Right;5 reps;Seated Goniometric ROM: 93 deg R knee flexion with AAROM Other Exercises Other Exercises: Standing weight shifting forward onto LLE and back onto RLE to promote involuntary contraction of RLE musculature.  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 and get back to baking PT Goal Formulation: With patient Time For Goal Achievement: 09/08/16 Potential to Achieve Goals: Good Progress towards PT goals: Progressing toward goals    Frequency    BID      PT Plan Current plan remains appropriate    Co-evaluation             End of Session Equipment Utilized During Treatment: Gait belt Activity Tolerance: Patient tolerated treatment well Patient left: with call bell/phone within reach;in bed;with bed alarm set;with SCD's reapplied;Other (comment) (with Polarcare in place; in bone foam)     Time: [id number] PT Time Calculation (min) (ACUTE ONLY): 32 min  Charges:  $Gait Training: 8-22 mins $Therapeutic Exercise: 8-22 mins                    G Codes:       Encarnacion ChuAshley Abashian PT, DPT 09/01/2016, 4:05 PM

## 2016-09-01 NOTE — Anesthesia Postprocedure Evaluation (Signed)
Anesthesia Post Note  Patient: Naaman PlummerValerie M Cocco  Procedure(s) Performed: Procedure(s) (LRB): COMPUTER ASSISTED TOTAL KNEE ARTHROPLASTY (Right)  Patient location during evaluation: Nursing Unit Anesthesia Type: Spinal Level of consciousness: awake and alert and oriented Pain management: pain level controlled Vital Signs Assessment: post-procedure vital signs reviewed and stable Respiratory status: spontaneous breathing, nonlabored ventilation and respiratory function stable Cardiovascular status: blood pressure returned to baseline Postop Assessment: no headache, no backache, adequate PO intake, no signs of nausea or vomiting and patient able to bend at knees Anesthetic complications: no     Last Vitals:  Vitals:   09/01/16 0401 09/01/16 0734  BP: 120/62 133/69  Pulse:  66  Resp:  16  Temp:  36.7 C    Last Pain:  Vitals:   09/01/16 0830  TempSrc:   PainSc: 4                  Marlana SalvageSandra Emoni Yang

## 2016-09-01 NOTE — Care Management Note (Signed)
Case Management Note  Patient Details  Name: Michele Jefferson MRN: [id number] Date of Birth: 08-Oct-1950  Subjective/Objective:   POD # 1   Right TKA. Met with patient who is sitting up in chair. She states she lives at home with her daughter. Has a Rolator but will need a walker. Ordered from [facility]. Offered choice of home health agencies. [facility]erral to [facility].  PCP is Carroll Sage, PA  at [facility]. Pharmacy: [facility]  [phone]. Called Lovenox 40 mg # 14, no refills.            Action/Plan: Walker from [facility], Lovenox called in, [facility]  Expected Discharge Date:    09/01/2016            Expected Discharge Plan:  [place]  In-House Referral:     Discharge planning Services  CM Consult  Post Acute Care Choice:  Durable Medical Equipment, Home Health Choice offered to:  Patient  DME Arranged:  Walker rolling DME Agency:  [place]:  PT [place]:  [facility]  Status of Service:  In process, will continue to follow  If discussed at Long Length of Stay Meetings, dates discussed:    Additional Comments:  Jolly Mango, RN 09/01/2016, 2:09 PM

## 2016-09-02 LAB — BASIC METABOLIC PANEL
ANION GAP: 6 (ref 5–15)
BUN: 13 mg/dL (ref 6–20)
CHLORIDE: 102 mmol/L (ref 101–111)
CO2: 28 mmol/L (ref 22–32)
CREATININE: 0.73 mg/dL (ref 0.44–1.00)
Calcium: 8.9 mg/dL (ref 8.9–10.3)
GFR calc non Af Amer: >60 mL/min (ref >60)
GLUCOSE: 125 mg/dL — AB (ref 65–99)
Potassium: 3.6 mmol/L (ref 3.5–5.1)
Sodium: 136 mmol/L (ref 135–145)

## 2016-09-02 LAB — CBC
HEMATOCRIT: 36.3 % (ref 35.0–47.0)
HEMOGLOBIN: 12 g/dL (ref 12.0–16.0)
MCH: 27.2 pg (ref 26.0–34.0)
MCHC: 33.1 g/dL (ref 32.0–36.0)
MCV: 82.1 fL (ref 80.0–100.0)
Platelets: 234 10*3/uL (ref 150–440)
RBC: 4.43 MIL/uL (ref 3.80–5.20)
RDW: 15.3 % — ABNORMAL HIGH (ref 11.5–14.5)
WBC: 8.3 10*3/uL (ref 3.6–11.0)

## 2016-09-02 LAB — GLUCOSE, CAPILLARY: Glucose-Capillary: 116 mg/dL — ABNORMAL HIGH (ref 65–99)

## 2016-09-02 MED ORDER — TAPENTADOL HCL 50 MG PO TABS: 50.0000 mg | ORAL_TABLET | ORAL | 0 refills | Status: DC | PRN

## 2016-09-02 MED ORDER — HYDROCODONE-ACETAMINOPHEN 5-325 MG PO TABS: 1.0000 | ORAL_TABLET | ORAL | 0 refills | Status: DC | PRN

## 2016-09-02 MED ORDER — TAPENTADOL HCL 50 MG PO TABS
50.0000 mg | ORAL_TABLET | ORAL | Status: DC | PRN
  Administered 2016-09-02: 50 mg via ORAL
  Filled 2016-09-02: qty 1

## 2016-09-02 MED ORDER — ENOXAPARIN SODIUM 40 MG/0.4ML ~~LOC~~ SOLN: 40.0000 mg | SUBCUTANEOUS | 0 refills | Status: DC

## 2016-09-02 MED ORDER — ENOXAPARIN SODIUM 40 MG/0.4ML ~~LOC~~ SOLN: 40.0000 mg | Freq: Two times a day (BID) | SUBCUTANEOUS | 0 refills | Status: DC

## 2016-09-02 NOTE — Progress Notes (Signed)
   Subjective: 2 Days Post-Op Procedure(s) (LRB): COMPUTER ASSISTED TOTAL KNEE ARTHROPLASTY (Right) Patient reports pain as mild.   Patient seen in rounds with Dr. Ernest PineHooten. Patient is well, and has had no acute complaints or problems Plan is to go Home after hospital stay. Negative for chest pain and shortness of breath Fever: no Gastrointestinal: Negative for nausea and vomiting. The patient had a bowel movement  Objective: Vital signs in last 24 hours: Temp:  [98 F (36.7 C)-98.7 F (37.1 C)] 98.5 F (36.9 C) (12/20 0403) Pulse Rate:  [66-75] 70 (12/20 0403) Resp:  [16-18] 16 (12/20 0403) BP: (133-181)/(69-78) 181/78 (12/20 0403) SpO2:  [91 %-100 %] 99 % (12/20 0403)  Intake/Output from previous day:  Intake/Output Summary (Last 24 hours) at 09/02/16 0657 Last data filed at 09/02/16 0500  Gross per 24 hour  Intake              360 ml  Output              280 ml  Net               80 ml    Intake/Output this shift: Total I/O In: -  Out: 280 [Drains:280]  Labs:  Recent Labs  08/31/16 0700 09/01/16 0423 09/02/16 0439  HGB 14.3 11.9* 12.0    Recent Labs  09/01/16 0423 09/02/16 0439  WBC 7.8 8.3  RBC 4.39 4.43  HCT 35.7 36.3  PLT 267 234    Recent Labs  09/01/16 0423 09/02/16 0439  NA 136 136  K 4.1 3.6  CL 106 102  CO2 25 28  BUN 11 13  CREATININE 0.80 0.73  GLUCOSE 112* 125*  CALCIUM 8.4* 8.9   No results for input(s): LABPT, INR in the last 72 hours.   EXAM General - Patient is Alert and Oriented. Lungs are clear to auscultation. Extremity - Sensation intact distally Dorsiflexion/Plantar flexion intact Compartment soft Dressing/Incision - clean, dry, no drainage, Hemovac Removed. The surgical dressing was removed with the honeycomb dressing intact. Motor Function - intact, moving foot and toes well on exam. The patient ambulated 450 feet with physical therapy.  Past Medical History:  Diagnosis Date  . Arthritis    generalized  .  Asthma   . GERD (gastroesophageal reflux disease)   . Headache    hx migraines  . Hypertension   . 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 Pre-diabetes     Assessment/Plan: 2 Days Post-Op Procedure(s) (LRB): COMPUTER ASSISTED TOTAL KNEE ARTHROPLASTY (Right) Active Problems:   S/P total knee arthroplasty  Estimated body mass index is 28.81 kg/m as calculated from the following:   Height as of this encounter: 5' 1.5" (1.562 m).   Weight as of this encounter: 70.3 kg (155 lb). The patient will discharge home today with home health physical therapy The patient has had a bowel movement.  DVT Prophylaxis - Lovenox, Foot Pumps and TED hose Weight-Bearing as tolerated to right leg  Michele Skeensodd Kenora Spayd, PA-C Orthopaedic Surgery 09/02/2016, 6:57 AM

## 2016-09-02 NOTE — Care Management Important Message (Signed)
Important Message  Patient Details  Name: Michele PlummerValerie M Jonas MRN: [id number] Date of Birth: 03/01/1951   Medicare Important Message Given:  Yes    Marily MemosLisa M Mattheo Swindle, RN 09/02/2016, 11:33 AM

## 2016-09-02 NOTE — Progress Notes (Signed)
Physical Therapy Treatment Patient Details Name: Michele Jefferson MRN: [id number] DOB: 07-08-1951 Today's Date: 09/02/2016    History of Present Illness Pt is a 65 y/o F s/p R TKA.      PT Comments    Pt with good tolerance to activities today. Pt presented in chair agreeable to therapy, agreeable to stair training. Per pt use of front stairs with 3 steps 2 rails.  Pt ascend/decend x4 steps 2 rails with step to pattern. Pt verbalized and demonstrated good understanding of stair management. Pt able to ambulate 629f after stair training demonstrating improved cadence with ambulation. Pt returned to room after ambulation agreeable to perform therex independently. Pt remained in chair after session indicating decreased pain from  6/10 to 3/10 and with all needs met.   Follow Up Recommendations  Home health PT     Equipment Recommendations  Rolling walker with 5" wheels    Recommendations for Other Services       Precautions / Restrictions Precautions Precautions: Fall;Knee Precaution Booklet Issued: No Restrictions Weight Bearing Restrictions: Yes RLE Weight Bearing: Weight bearing as tolerated    Mobility  Bed Mobility                  Transfers Overall transfer level: Needs assistance Equipment used: Rolling walker (2 wheeled) Transfers: Sit to/from Stand Sit to Stand: Supervision         General transfer comment: Demonstrated good safety with transfers   Ambulation/Gait Ambulation/Gait assistance: Supervision Ambulation Distance (Feet): 640 Feet Assistive device: Rolling walker (2 wheeled) Gait Pattern/deviations: Step-through pattern;Decreased step length - right;Decreased stance time - right Gait velocity: decreased Gait velocity interpretation: Below normal speed for age/gender General Gait Details: Min cues for increasing heel strike and increasing R knee flexion    Stairs Stairs: Yes   Stair Management: Two rails Number of Stairs: 4 General  stair comments: Able to perform with 2 rails demonstrating good safety   Wheelchair Mobility    Modified Rankin (Stroke Patients Only)       Balance Overall balance assessment: Needs assistance Sitting-balance support: No upper extremity supported Sitting balance-Leahy Scale: Good     Standing balance support: Bilateral upper extremity supported Standing balance-Leahy Scale: Fair                      Cognition Arousal/Alertness: Awake/alert Behavior During Therapy: WFL for tasks assessed/performed Overall Cognitive Status: Within Functional Limits for tasks assessed                      Exercises      General Comments        Pertinent Vitals/Pain Pain Assessment: 0-10 Pain Score: 6  Pain Location: R knee Pain Descriptors / Indicators: Aching;Discomfort;Operative site guarding Pain Intervention(s): Limited activity within patient's tolerance;Monitored during session    Home Living                      Prior Function            PT Goals (current goals can now be found in the care plan section) Acute Rehab PT Goals Patient Stated Goal: 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

## 2016-09-02 NOTE — Progress Notes (Signed)
Tylenol given for pain because pt does not like to take strong medication. Dsg dry and intact with polar care in place. MOM with no results. No acute distress noted. Staff will continue to monitor. Blood pressure is up this am

## 2016-09-02 NOTE — Progress Notes (Signed)
Patient was discharged home with [facility] via wheelchair and [place], [place]. IVs removed with caths intact. Reviewed lovenox kit and patient able to demo given. Script given and last dose of meds given also reviewed. Allowed time for questions.

## 2016-09-02 NOTE — Discharge Summary (Signed)
Physician Discharge Summary  Subjective: 2 Days Post-Op Procedure(s) (LRB): COMPUTER ASSISTED TOTAL KNEE ARTHROPLASTY (Right) Patient reports pain as mild.   Patient seen in rounds with Dr. Ernest PineHooten. Patient is well, and has had no acute complaints or problems Patient is ready to go home with home health physical therapy  Physician Discharge Summary  Patient ID: Michele PlummerValerie M Jefferson MRN: [id number] DOB/AGE: 67/02/1951 67 y.o.  Admit date: 08/31/2016 Discharge date: 09/02/2016  Admission Diagnoses:  Discharge Diagnoses:  Active Problems:   S/P total knee arthroplasty   Discharged Condition: good  Hospital Course: The patient is postop day 2 from a right total knee replacement. She has done very well with physical therapy she is ambulating 450 feet. The patient had a bowel movement. Her labs have remained stable. Her blood pressure did elevate to 181/78. The patient is tolerating moving around the room independently and is ready to go home.  Treatments: surgery:  Right total knee arthroplasty using computer-assisted navigation  SURGEON:  Jena GaussJames P Hooten, Jr. M.D.  ASSISTANT:  Dedra Skeensodd Joliyah Lippens, PA-C (present and scrubbed throughout the case, critical for assistance with exposure, retraction, instrumentation, and closure)  ANESTHESIA: spinal  ESTIMATED BLOOD LOSS: 50 mL  FLUIDS REPLACED: 1300 mL of crystalloid  TOURNIQUET TIME: 104 minutes  DRAINS: 2 medium drains to a reinfusion system  SOFT TISSUE RELEASES: Anterior cruciate ligament, posterior cruciate ligament, deep medial collateral ligament, patellofemoral ligament  IMPLANTS UTILIZED: DePuy Attune size 4 posterior stabilized femoral component (cemented), size 4 rotating platform tibial component (cemented), 32 mm medialized dome patella (cemented), and a 5 mm stabilized rotating platform polyethylene insert  Discharge Exam: Blood pressure (!) 197/80, pulse 69, temperature 98.5 F (36.9 C), temperature source Oral,  resp. rate 16, height 5' 1.5" (1.562 m), weight 70.3 kg (155 lb), SpO2 99 %.   Disposition: 01-Home or Self Care   Allergies as of 09/02/2016      Reactions   Fluticasone-salmeterol Shortness Of Breath   Celecoxib Other (See Comments)   Hypertension   Meloxicam Other (See Comments)   Hypertension   Oxycodone-acetaminophen Rash, Swelling   Cortisone Swelling, Other (See Comments)   Shot in knee cause swelling in knee/leg resulting in hospitalization   Tramadol Nausea And Vomiting   Doxycycline Nausea And Vomiting   Rofecoxib Rash      Medication List    TAKE these medications   albuterol 108 (90 Base) MCG/ACT inhaler Commonly known as:  PROVENTIL HFA;VENTOLIN HFA Inhale 1-2 puffs into the lungs every 6 (six) hours as needed for shortness of breath.   dorzolamide 2 % ophthalmic solution Commonly known as:  TRUSOPT Place 1 drop into both eyes 2 (two) times daily.   enoxaparin 40 MG/0.4ML injection Commonly known as:  LOVENOX Inject 0.4 mLs (40 mg total) into the skin daily.   ibuprofen 200 MG tablet Commonly known as:  ADVIL,MOTRIN Take 400-800 mg by mouth every 8 (eight) hours as needed (for pain.).   lisinopril 5 MG tablet Commonly known as:  PRINIVIL,ZESTRIL Take 5 mg by mouth daily.   tapentadol 50 MG tablet Commonly known as:  NUCYNTA Take 1-2 tablets (50-100 mg total) by mouth every 4 (four) hours as needed for moderate pain.            Durable Medical Equipment        Start     Ordered   08/31/16 1325  DME Walker rolling  Once    Question:  Patient needs a walker to treat with the following  condition  Answer:  Total knee replacement status   08/31/16 1324   08/31/16 1325  DME Bedside commode  Once    Question:  Patient needs a bedside commode to treat with the following condition  Answer:  Total knee replacement status   08/31/16 1324     Follow-up Information    WOLFE,JON R., PA Follow up on 09/15/2016.   Specialty:  Physician Assistant Why:  At  1:15 PM Contact information: [address] [facility] [place] [address] [phone]        Donato HeinzHOOTEN,JAMES P, MD Follow up on 10/13/2016.   Specialty:  Orthopedic Surgery Why:  At 10 AM Contact information: [address] [facility] [place] [address] [phone]           Signed: [name], Michele Jefferson 09/02/2016, 10:14 AM   Objective: Vital signs in last 24 hours: Temp:  [98.5 F (36.9 C)-98.7 F (37.1 C)] 98.5 F (36.9 C) (12/20 0403) Pulse Rate:  [69-75] 69 (12/20 0732) Resp:  [16-18] 16 (12/20 0732) BP: (158-197)/(69-80) 197/80 (12/20 0732) SpO2:  [91 %-100 %] 99 % (12/20 0732)  Intake/Output from previous day:  Intake/Output Summary (Last 24 hours) at 09/02/16 1014 Last data filed at 09/02/16 0904  Gross per 24 hour  Intake              360 ml  Output              280 ml  Net               80 ml    Intake/Output this shift: Total I/O In: 240 [P.O.:240] Out: -   Labs:  Recent Labs  08/31/16 0700 09/01/16 0423 09/02/16 0439  HGB 14.3 11.9* 12.0    Recent Labs  09/01/16 0423 09/02/16 0439  WBC 7.8 8.3  RBC 4.39 4.43  HCT 35.7 36.3  PLT 267 234    Recent Labs  09/01/16 0423 09/02/16 0439  NA 136 136  K 4.1 3.6  CL 106 102  CO2 25 28  BUN 11 13  CREATININE 0.80 0.73  GLUCOSE 112* 125*  CALCIUM 8.4* 8.9   No results for input(s): LABPT, INR in the last 72 hours.  EXAM: General - Patient is Alert and Oriented Extremity - Sensation intact distally Dorsiflexion/Plantar flexion intact No cellulitis present Incision - clean, dry, no drainage Motor Function -  the patient can independently straight leg raise. She ambulated 450 feet with physical therapy. She can plantarflex and dorsiflex her foot with no complication.  Assessment/Plan: 2 Days Post-Op Procedure(s) (LRB): COMPUTER ASSISTED TOTAL KNEE ARTHROPLASTY (Right) Procedure(s) (LRB): COMPUTER ASSISTED TOTAL KNEE ARTHROPLASTY  (Right) Past Medical History:  Diagnosis Date  . Arthritis    generalized  . Asthma   . GERD (gastroesophageal reflux disease)   . Headache    hx migraines  . Hypertension   . 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 Pre-diabetes    Active Problems:   S/P total knee arthroplasty  Estimated body mass index is 28.81 kg/m as calculated from the following:   Height as of this encounter: 5' 1.5" (1.562 m).   Weight as of this encounter: 70.3 kg (155 lb). Discharge home with home health Diet - Regular diet Follow up - in 2 weeks Activity - WBAT Disposition - Home Condition Upon Discharge - Good DVT Prophylaxis - Lovenox and TED hose  Dedra Skeensodd Fraidy Mccarrick, PA-C Orthopaedic Surgery 09/02/2016, 10:14 AM

## 2016-09-02 NOTE — Care Management Note (Signed)
Case Management Note  Patient Details  Name: Michele Jefferson MRN: [id number] Date of Birth: 02/04/1951  Subjective/Objective:  Discharging today. Cost of Lovenox is $ 3.30. Patient updated.                    Action/Plan: [facility] notified of discharge. Walker delivered.   Expected Discharge Date:  09/02/16               Expected Discharge Plan:  Home w Home Health Services  In-House Referral:     Discharge planning Services  CM Consult  Post Acute Care Choice:  Durable Medical Equipment, Home Health Choice offered to:  Patient  DME Arranged:  Walker rolling DME Agency:  [facility]  HH Arranged:  PT HH Agency[facility]  [facility]  Status of Service:  Completed, signed off  If discussed at [employer] Length of Stay Meetings, dates discussed:    Additional Comments:  Marily MemosLisa M Zyanna Leisinger, RN 09/02/2016, 9:59 AM

## 2016-09-15 ENCOUNTER — Other Ambulatory Visit: Payer: Self-pay
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
## 2016-10-15 ENCOUNTER — Other Ambulatory Visit: Payer: Self-pay

## 2016-10-15 NOTE — Patient Outreach (Signed)
[facility]) Care Management  10/15/2016  Naaman PlummerValerie M Caya 03/06/1951 [id number]  Telephonic Monthly Assessment   Referral Date: 09/08/16 Emmi General Discharge: [facility]) Issues: Questions about discharge papers and know who to call about changes in condition? [facility]charge [facility]: [facility]02[facility], Screening, Initial Assessment: 09/15/2016  Program: HTN 09/15/2016 Insurance:  Elton SinHumana Gold  Outreach call #1 to patient.  Patient not reached.  [address]  [place] [address] [phone] (M) RN CM left HIPAA compliant voice message with name and number for call back.  RN CM scheduled for next outreach call within one week.   Simmie Daviesrystal Samariah Hokenson, MSHL, BSN, RN, CCM  [facility] Care Management Care Management Coordinator [phone] Direct [phone] Cell [phone] Office [phone] Fax [email or web address]

## 2016-10-16 ENCOUNTER — Other Ambulatory Visit: Payer: Self-pay

## 2016-10-16 NOTE — Patient Outreach (Signed)
[facility]) Care Management  10/16/2016  Michele PlummerValerie M Jefferson 02/10/1951 [id number]  Telephonic Monthly Assessment   Referral Date: 09/08/16 Emmi General Discharge: [facility]) Issues: Questions about discharge papers and know who to call about changes in condition? [facility]: [facility], Screening, Initial Assessment: 09/15/2016  Program: HTN 09/15/2016 Insurance:  Elton SinHumana Gold  Outreach call #2 to patient.  Patient not reached.  [address]  [place] [address] [phone] (M) RN CM left HIPAA compliant voice message with name and number for call back.  RN CM scheduled for next outreach call within one week.   Simmie Daviesrystal Estil Vallee, MSHL, BSN, RN, CCM  [facility] Care Management Care Management Coordinator [phone] Direct [phone] Cell [phone] Office [phone] Fax [email or web address]

## 2016-10-19 ENCOUNTER — Ambulatory Visit: Payer: Self-pay

## 2016-10-20 ENCOUNTER — Ambulatory Visit: Payer: Self-pay

## 2016-10-21 ENCOUNTER — Ambulatory Visit: Payer: Self-pay

## 2016-10-22 ENCOUNTER — Ambulatory Visit: Payer: Self-pay

## 2016-10-23 ENCOUNTER — Other Ambulatory Visit: Payer: Self-pay

## 2016-10-23 NOTE — Patient Outreach (Signed)
[facility]) Care Management  10/23/2016  Naaman PlummerValerie M Hulen 02/23/1951 [id number]  Telephonic Monthly Assessment   Referral Date: 09/08/16 Emmi [facility] Discharge: [facility]) Issues: Questions about discharge papers and know who to call about changes in condition? Discharge Date: 09/02/16 [facility] Discharge Program, Screening, Initial Assessment: 09/15/2016  Program: HTN 09/15/2016 Insurance:  [employer]  Outreach call #3 to patient.  Patient not reached.  [address]  [place] [id number] [phone] (M) RN CM left HIPAA compliant voice message with name and number for call back.    RN CM mailed unsuccessful outreach letter to patient and will close case if no response received back over the next 2 weeks.   Simmie Daviesrystal Chastin Riesgo, MSHL, BSN, RN, CCM  [facility] Care Management Care Management Coordinator [phone] Direct [phone] Cell [phone] Office [phone] Fax [email or web address]

## 2016-11-06 ENCOUNTER — Ambulatory Visit: Payer: Self-pay

## 2016-11-11 ENCOUNTER — Other Ambulatory Visit: Payer: Self-pay

## 2016-11-11 NOTE — Patient Outreach (Signed)
[facility]) Care Management  11/11/2016  Michele Jefferson 08/11/1951 [id number]   Case Closure  Patient unable to reach with several phone attempts and no response received back from unsuccessful outreach letter.  [facility] notified: no longer able to maintain patient engagement.  Case Closure letter sent to patient and PCP.   Simmie Daviesrystal Lashawna Poche, MSHL, BSN, RN, CCM  [facility] Care Management Care Management Coordinator [phone] Direct [phone] Cell [phone] Office [phone] Fax [email or web address]

## 2017-06-07 ENCOUNTER — Other Ambulatory Visit: Payer: Self-pay | Admitting: Physician Assistant

## 2017-06-07 DIAGNOSIS — Z0000 Encounter for general adult medical examination without abnormal findings: Secondary | ICD-10-CM

## 2017-06-22 ENCOUNTER — Ambulatory Visit
Admission: RE | Admit: 2017-06-22 | Discharge: 2017-06-22 | Disposition: A | Discharge status: Home / Self Care | Payer: Medicare PPO | Source: Ambulatory Visit | Attending: Internal Medicine | Admitting: Internal Medicine

## 2017-06-22 ENCOUNTER — Encounter
Admission: RE | Disposition: A | Discharge status: Home / Self Care | Payer: Self-pay | Source: Ambulatory Visit | Attending: Internal Medicine

## 2017-06-22 DIAGNOSIS — R079 Chest pain, unspecified: Secondary | ICD-10-CM | POA: Insufficient documentation

## 2017-06-22 DIAGNOSIS — I10 Essential (primary) hypertension: Secondary | ICD-10-CM | POA: Diagnosis not present

## 2017-06-22 DIAGNOSIS — R002 Palpitations: Secondary | ICD-10-CM | POA: Diagnosis not present

## 2017-06-22 DIAGNOSIS — Z87891 Personal history of nicotine dependence: Secondary | ICD-10-CM | POA: Diagnosis not present

## 2017-06-22 DIAGNOSIS — J449 Chronic obstructive pulmonary disease, unspecified: Secondary | ICD-10-CM | POA: Insufficient documentation

## 2017-06-22 DIAGNOSIS — Z79899 Other long term (current) drug therapy: Secondary | ICD-10-CM | POA: Diagnosis not present

## 2017-06-22 HISTORY — PX: LEFT HEART CATH AND CORONARY ANGIOGRAPHY: CATH118249

## 2017-06-22 LAB — CARDIAC CATHETERIZATION: CATHEFQUANT: 60 %

## 2017-06-22 LAB — GLUCOSE, CAPILLARY: Glucose-Capillary: 105 mg/dL — ABNORMAL HIGH (ref 65–99)

## 2017-06-22 SURGERY — LEFT HEART CATH AND CORONARY ANGIOGRAPHY
Anesthesia: Moderate Sedation

## 2017-06-22 SURGERY — LEFT HEART CATH AND CORONARY ANGIOGRAPHY
Anesthesia: Moderate Sedation | Laterality: Left

## 2017-06-22 MED ORDER — SODIUM CHLORIDE 0.9 % WEIGHT BASED INFUSION
3.0000 mL/kg/h | INTRAVENOUS | Status: AC
  Administered 2017-06-22: 3 mL/kg/h via INTRAVENOUS

## 2017-06-22 MED ORDER — MIDAZOLAM HCL 2 MG/2ML IJ SOLN
INTRAMUSCULAR | Status: DC | PRN
  Administered 2017-06-22: 1 mg via INTRAVENOUS

## 2017-06-22 MED ORDER — IOPAMIDOL (ISOVUE-300) INJECTION 61%
INTRAVENOUS | Status: DC | PRN
  Administered 2017-06-22: 80 mL via INTRA_ARTERIAL

## 2017-06-22 MED ORDER — MIDAZOLAM HCL 2 MG/2ML IJ SOLN
INTRAMUSCULAR | Status: AC
  Filled 2017-06-22: qty 2

## 2017-06-22 MED ORDER — FENTANYL CITRATE (PF) 100 MCG/2ML IJ SOLN
INTRAMUSCULAR | Status: DC | PRN
  Administered 2017-06-22: 25 ug via INTRAVENOUS

## 2017-06-22 MED ORDER — FENTANYL CITRATE (PF) 100 MCG/2ML IJ SOLN
INTRAMUSCULAR | Status: AC
  Filled 2017-06-22: qty 2

## 2017-06-22 MED ORDER — SODIUM CHLORIDE 0.9% FLUSH: 3.0000 mL | Freq: Two times a day (BID) | INTRAVENOUS | Status: DC

## 2017-06-22 MED ORDER — HEPARIN (PORCINE) IN NACL 2-0.9 UNIT/ML-% IJ SOLN
INTRAMUSCULAR | Status: AC
  Filled 2017-06-22: qty 500

## 2017-06-22 MED ORDER — SODIUM CHLORIDE 0.9 % WEIGHT BASED INFUSION: 1.0000 mL/kg/h | INTRAVENOUS | Status: DC

## 2017-06-22 MED ORDER — SODIUM CHLORIDE 0.9 % IV SOLN: 250.0000 mL | INTRAVENOUS | Status: DC | PRN

## 2017-06-22 MED ORDER — ASPIRIN 81 MG PO CHEW
CHEWABLE_TABLET | ORAL | Status: AC
  Filled 2017-06-22: qty 1

## 2017-06-22 MED ORDER — ASPIRIN 81 MG PO CHEW
81.0000 mg | CHEWABLE_TABLET | ORAL | Status: AC
  Administered 2017-06-22: 81 mg via ORAL

## 2017-06-22 MED ORDER — SODIUM CHLORIDE 0.9% FLUSH: 3.0000 mL | INTRAVENOUS | Status: DC | PRN

## 2017-06-22 SURGICAL SUPPLY — 10 items
CATH INFINITI 5FR ANG PIGTAIL (CATHETERS) ×2 IMPLANT
CATH INFINITI 5FR JL4 (CATHETERS) ×2 IMPLANT
CATH INFINITI JR4 5F (CATHETERS) ×2 IMPLANT
DEVICE CLOSURE MYNXGRIP 5F (Vascular Products) ×2 IMPLANT
KIT MANI 3VAL PERCEP (MISCELLANEOUS) ×3 IMPLANT
NDL PERC 18GX7CM (NEEDLE) IMPLANT
NEEDLE PERC 18GX7CM (NEEDLE) ×3 IMPLANT
PACK CARDIAC CATH (CUSTOM PROCEDURE TRAY) ×3 IMPLANT
SHEATH AVANTI 5FR X 11CM (SHEATH) ×3 IMPLANT
WIRE EMERALD 3MM-J .035X150CM (WIRE) ×3 IMPLANT

## 2017-06-22 NOTE — Discharge Instructions (Signed)
Place your patient instructions text here.Place your patient instructions text here.Femoral Site Care Refer to this sheet in the next few weeks. These instructions provide you with information about caring for yourself after your procedure. Your health care provider may also give you more specific instructions. Your treatment has been planned according to current medical practices, but problems sometimes occur. Call your health care provider if you have any problems or questions after your procedure. What can I expect after the procedure? After your procedure, it is typical to have the following:  Bruising at the site that usually fades within 1-2 weeks.  Blood collecting in the tissue (hematoma) that may be painful to the touch. It should usually decrease in size and tenderness within 1-2 weeks.  Follow these instructions at home:  Take medicines only as directed by your health care provider.  You may shower 24-48 hours after the procedure or as directed by your health care provider. Remove the bandage (dressing) and gently wash the site with plain soap and water. Pat the area dry with a clean towel. Do not rub the site, because this may cause bleeding.  Do not take baths, swim, or use a hot tub until your health care provider approves.  Check your insertion site every day for redness, swelling, or drainage.  Do not apply powder or lotion to the site.  Limit use of stairs to twice a day for the first 2-3 days or as directed by your health care provider.  Do not squat for the first 2-3 days or as directed by your health care provider.  Do not lift over 10 lb (4.5 kg) for 5 days after your procedure or as directed by your health care provider.  Ask your health care provider when it is okay to: ? Return to work or school. ? Resume usual physical activities or sports. ? Resume sexual activity.  Do not drive home if you are discharged the same day as the procedure. Have someone else drive  you.  You may drive 24 hours after the procedure unless otherwise instructed by your health care provider.  Do not operate machinery or power tools for 24 hours after the procedure or as directed by your health care provider.  If your procedure was done as an outpatient procedure, which means that you went home the same day as your procedure, a responsible adult should be with you for the first 24 hours after you arrive home.  Keep all follow-up visits as directed by your health care provider. This is important. Contact a health care provider if:  You have a fever.  You have chills.  You have increased bleeding from the site. Hold pressure on the site. Get help right away if:  You have unusual pain at the site.  You have redness, warmth, or swelling at the site.  You have drainage (other than a small amount of blood on the dressing) from the site.  The site is bleeding, and the bleeding does not stop after 30 minutes of holding steady pressure on the site.  Your leg or foot becomes pale, cool, tingly, or numb. This information is not intended to replace advice given to you by your health care provider. Make sure you discuss any questions you have with your health care provider. Document Released: 05/04/2014 Document Revised: 02/06/2016 Document Reviewed: 03/20/2014 Elsevier Interactive Patient Education  2018 Elsevier [employer]. Moderate Conscious Sedation, Adult, Care After These instructions provide you with information about caring for yourself after your  procedure. Your health care provider may also give you more specific instructions. Your treatment has been planned according to current medical practices, but problems sometimes occur. Call your health care provider if you have any problems or questions after your procedure. What can I expect after the procedure? After your procedure, it is common:  To feel sleepy for several hours.  To feel clumsy and have poor balance for  several hours.  To have poor judgment for several hours.  To vomit if you eat too soon.  Follow these instructions at home: For at least 24 hours after the procedure:   Do not: ? Participate in activities where you could fall or become injured. ? Drive. ? Use heavy machinery. ? Drink alcohol. ? Take sleeping pills or medicines that cause drowsiness. ? Make important decisions or sign legal documents. ? Take care of children on your own.  Rest. Eating and drinking  Follow the diet recommended by your health care provider.  If you vomit: ? Drink water, juice, or soup when you can drink without vomiting. ? Make sure you have little or no nausea before eating solid foods. General instructions  Have a responsible adult stay with you until you are awake and alert.  Take over-the-counter and prescription medicines only as told by your health care provider.  If you smoke, do not smoke without supervision.  Keep all follow-up visits as told by your health care provider. This is important. Contact a health care provider if:  You keep feeling nauseous or you keep vomiting.  You feel light-headed.  You develop a rash.  You have a fever. Get help right away if:  You have trouble breathing. This information is not intended to replace advice given to you by your health care provider. Make sure you discuss any questions you have with your health care provider. Document Released: 06/21/2013 Document Revised: 02/03/2016 Document Reviewed: 12/21/2015 Elsevier Interactive Patient Education  [employer].

## 2017-06-22 NOTE — Progress Notes (Signed)
Pt sitting up in bed, eating and drinking at this time w/o difficulty, in NAD.  Pt daughters at bedside.  Discharge and follow up instructions reviewed, pt and family verbalize understanding.

## 2017-06-23 ENCOUNTER — Encounter: Payer: Self-pay | Admitting: Internal Medicine

## 2017-09-11 ENCOUNTER — Other Ambulatory Visit: Payer: Self-pay

## 2017-09-11 ENCOUNTER — Encounter: Payer: Self-pay | Admitting: Emergency Medicine

## 2017-09-11 ENCOUNTER — Emergency Department
Admission: EM | Admit: 2017-09-11 | Discharge: 2017-09-11 | Discharge status: Against Medical Advice | Payer: Medicare PPO | Attending: Emergency Medicine | Admitting: Emergency Medicine

## 2017-09-11 DIAGNOSIS — I10 Essential (primary) hypertension: Secondary | ICD-10-CM | POA: Diagnosis present

## 2017-09-11 DIAGNOSIS — Z5321 Procedure and treatment not carried out due to patient leaving prior to being seen by health care provider: Secondary | ICD-10-CM | POA: Diagnosis not present

## 2017-09-11 LAB — CBC WITH DIFFERENTIAL/PLATELET
Basophils Absolute: 0 10*3/uL (ref 0–0.1)
Basophils Relative: 1 %
EOS ABS: 0.2 10*3/uL (ref 0–0.7)
EOS PCT: 3 %
HCT: 42.2 % (ref 35.0–47.0)
Hemoglobin: 14 g/dL (ref 12.0–16.0)
Lymphocytes Relative: 41 %
Lymphs Abs: 2.3 10*3/uL (ref 1.0–3.6)
MCH: 26.5 pg (ref 26.0–34.0)
MCHC: 33.3 g/dL (ref 32.0–36.0)
MCV: 79.8 fL — ABNORMAL LOW (ref 80.0–100.0)
MONO ABS: 0.5 10*3/uL (ref 0.2–0.9)
MONOS PCT: 8 %
Neutro Abs: 2.8 10*3/uL (ref 1.4–6.5)
Neutrophils Relative %: 47 %
Platelets: 260 10*3/uL (ref 150–440)
RBC: 5.29 MIL/uL — ABNORMAL HIGH (ref 3.80–5.20)
RDW: 15.6 % — AB (ref 11.5–14.5)
WBC: 5.8 10*3/uL (ref 3.6–11.0)

## 2017-09-11 LAB — BASIC METABOLIC PANEL
Anion gap: 8 (ref 5–15)
BUN: 14 mg/dL (ref 6–20)
CALCIUM: 9.3 mg/dL (ref 8.9–10.3)
CHLORIDE: 102 mmol/L (ref 101–111)
CO2: 29 mmol/L (ref 22–32)
CREATININE: 0.85 mg/dL (ref 0.44–1.00)
GFR calc non Af Amer: >60 mL/min (ref >60)
Glucose, Bld: 95 mg/dL (ref 65–99)
Potassium: 3.6 mmol/L (ref 3.5–5.1)
SODIUM: 139 mmol/L (ref 135–145)

## 2017-09-11 LAB — TROPONIN I: Troponin I: <0.03 ng/mL (ref <0.03)

## 2017-09-11 NOTE — ED Notes (Signed)
No answer when called for treatment room.  ?

## 2017-09-11 NOTE — ED Triage Notes (Signed)
Pt arrives POV to triage with c/o HTN. Pt reports that her BP at home was 199/110. Pt also reports that she had some left arm pain. Pt is in NAD at this time.

## 2017-09-13 ENCOUNTER — Telehealth: Payer: Self-pay | Admitting: Emergency Medicine

## 2017-09-13 NOTE — Telephone Encounter (Signed)
Called patient due to lwot to inquire about condition and follow up plans. No answer and no voicemail available to leave message.

## 2017-10-10 IMAGING — DX DG KNEE 1-2V PORT*R*
2 series · 2 of 2 positions shown · non-contrast
Comparison: No recent prior.

CLINICAL DATA: Right knee replacement.

EXAM:
PORTABLE RIGHT KNEE - 1-2 VIEW

[knee ap]
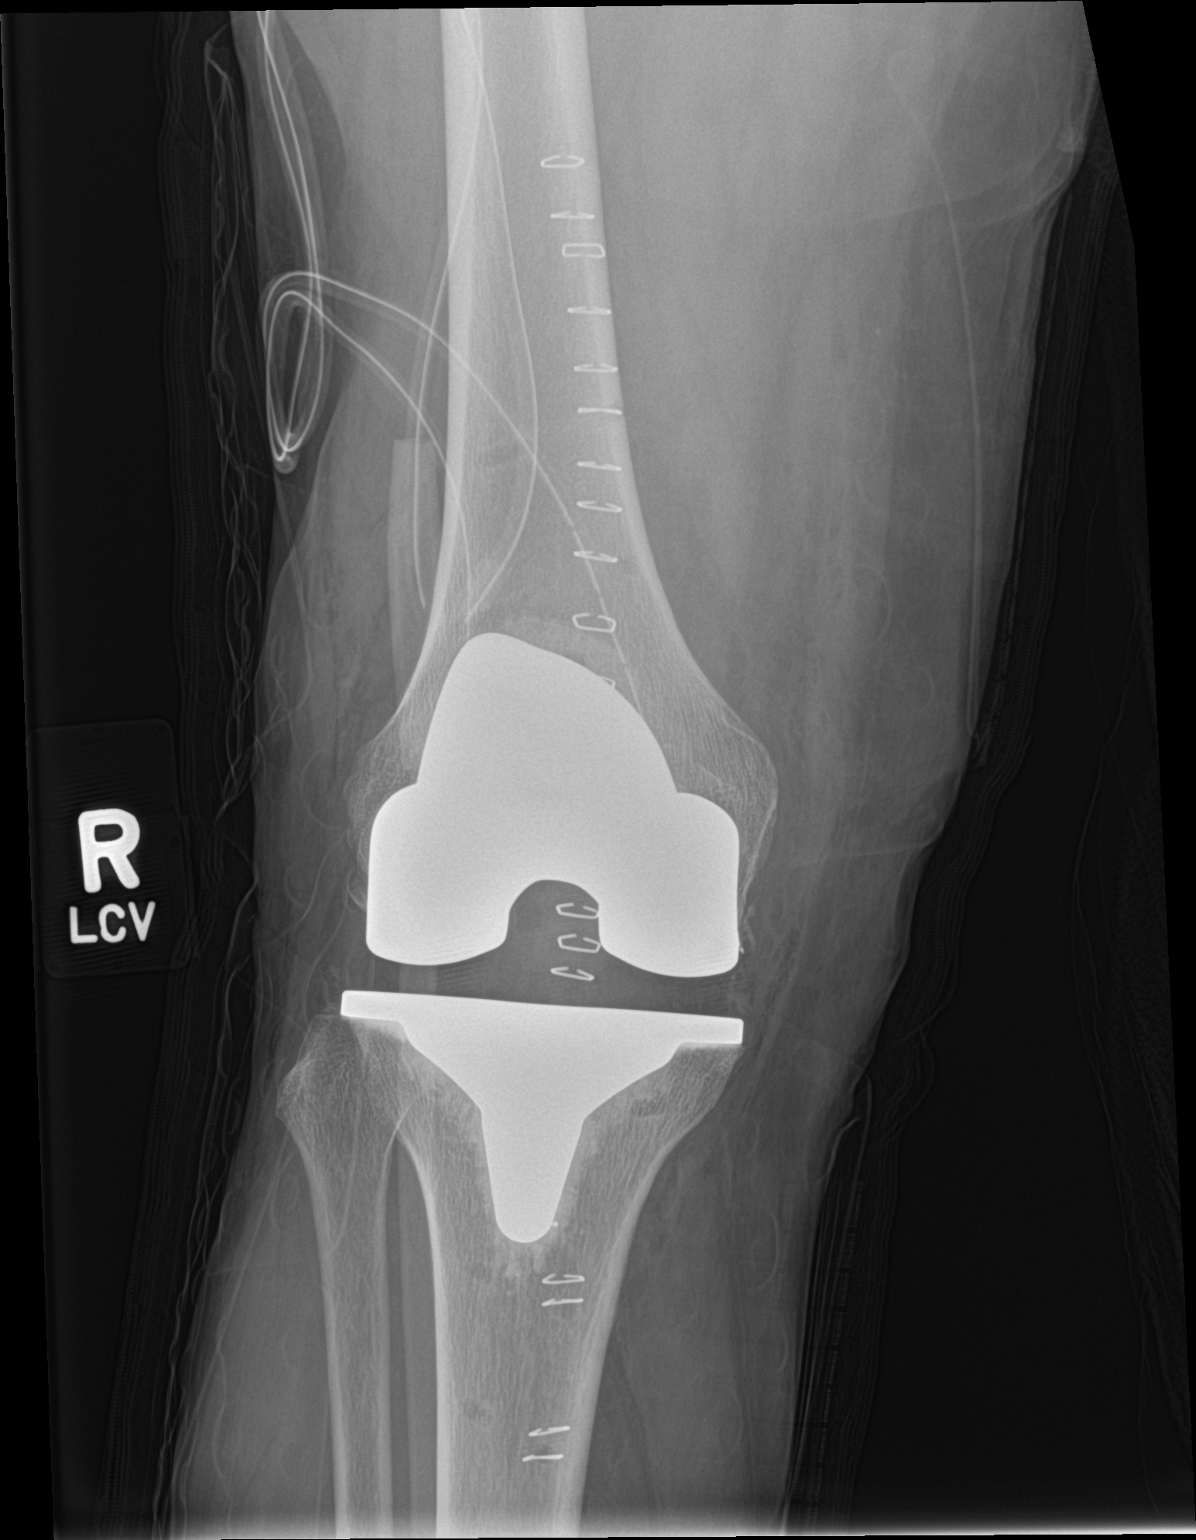

[knee lat]
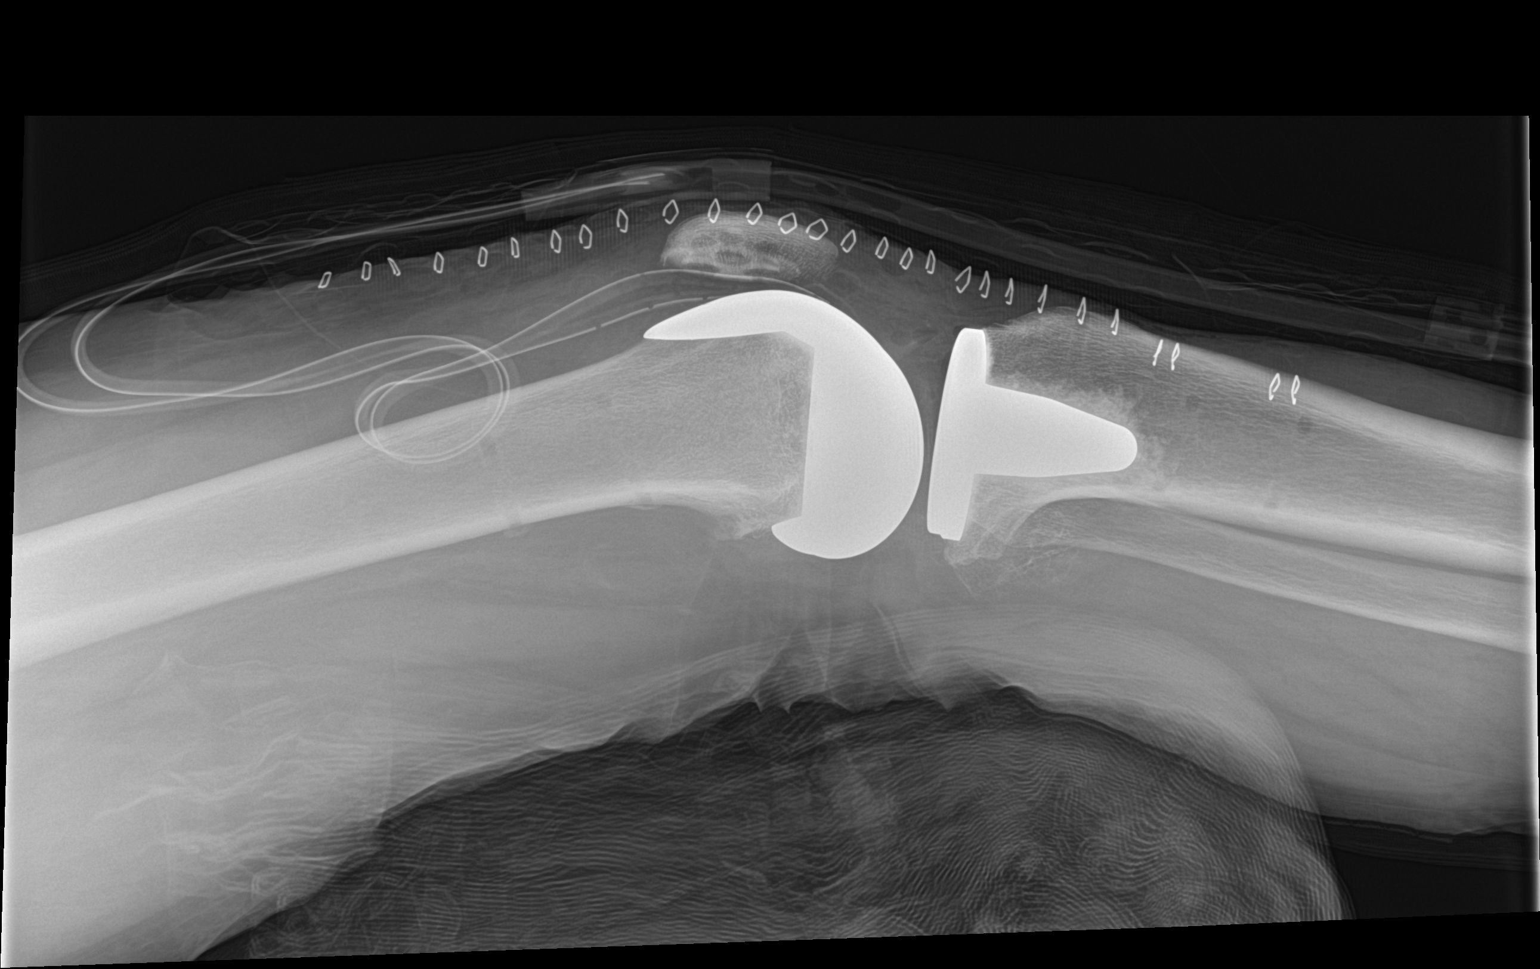

[2 of 2 positions shown; findings below may reference images not displayed]

FINDINGS: Total right knee replacement. Hardware intact. Normal alignment. No
acute abnormality identified.
IMPRESSION: Total right knee replacement.

## 2018-06-21 ENCOUNTER — Other Ambulatory Visit: Payer: Self-pay | Admitting: Physician Assistant

## 2018-06-21 DIAGNOSIS — Z1231 Encounter for screening mammogram for malignant neoplasm of breast: Secondary | ICD-10-CM

## 2018-07-29 ENCOUNTER — Other Ambulatory Visit: Payer: Self-pay

## 2018-07-29 ENCOUNTER — Emergency Department
Admission: EM | Admit: 2018-07-29 | Discharge: 2018-07-29 | Disposition: A | Discharge status: Home / Self Care | Payer: Medicare PPO | Attending: Emergency Medicine | Admitting: Emergency Medicine

## 2018-07-29 DIAGNOSIS — Z96651 Presence of right artificial knee joint: Secondary | ICD-10-CM | POA: Diagnosis not present

## 2018-07-29 DIAGNOSIS — J45909 Unspecified asthma, uncomplicated: Secondary | ICD-10-CM | POA: Diagnosis not present

## 2018-07-29 DIAGNOSIS — E119 Type 2 diabetes mellitus without complications: Secondary | ICD-10-CM | POA: Insufficient documentation

## 2018-07-29 DIAGNOSIS — I10 Essential (primary) hypertension: Secondary | ICD-10-CM

## 2018-07-29 DIAGNOSIS — Z87891 Personal history of nicotine dependence: Secondary | ICD-10-CM | POA: Diagnosis not present

## 2018-07-29 LAB — COMPREHENSIVE METABOLIC PANEL
ALT: 39 U/L (ref 0–44)
AST: 25 U/L (ref 15–41)
Albumin: 4.2 g/dL (ref 3.5–5.0)
Alkaline Phosphatase: 84 U/L (ref 38–126)
Anion gap: 6 (ref 5–15)
BUN: 16 mg/dL (ref 8–23)
CHLORIDE: 106 mmol/L (ref 98–111)
CO2: 29 mmol/L (ref 22–32)
Calcium: 9.2 mg/dL (ref 8.9–10.3)
Creatinine, Ser: 0.78 mg/dL (ref 0.44–1.00)
Glucose, Bld: 99 mg/dL (ref 70–99)
POTASSIUM: 3.7 mmol/L (ref 3.5–5.1)
Sodium: 141 mmol/L (ref 135–145)
Total Bilirubin: 0.4 mg/dL (ref 0.3–1.2)
Total Protein: 7.2 g/dL (ref 6.5–8.1)

## 2018-07-29 LAB — CBC
HCT: 41.6 % (ref 36.0–46.0)
Hemoglobin: 13.4 g/dL (ref 12.0–15.0)
MCH: 26.8 pg (ref 26.0–34.0)
MCHC: 32.2 g/dL (ref 30.0–36.0)
MCV: 83.2 fL (ref 80.0–100.0)
NRBC: 0 % (ref 0.0–0.2)
PLATELETS: 272 10*3/uL (ref 150–400)
RBC: 5 MIL/uL (ref 3.87–5.11)
RDW: 14.8 % (ref 11.5–15.5)
WBC: 6 10*3/uL (ref 4.0–10.5)

## 2018-07-29 NOTE — ED Notes (Signed)
2 unsuccessful attempts top draw blood for labs by this RN. [place], EDT to collect sample at this time.

## 2018-07-29 NOTE — ED Provider Notes (Signed)
[facility] Emergency Department Provider Note       Time seen: ----------------------------------------- 7:29 PM on 07/29/2018 -----------------------------------------   I have reviewed the triage vital signs and the nursing notes.  HISTORY   Chief Complaint Hypertension    HPI Michele Jefferson is a 67 y.o. female with a history of arthritis, asthma, GERD, headache, hypertension and prediabetes who presents to the ED for hypertension for the last day.  Patient reports blood pressure at home was elevated to 170/112.  She reports compliance intermittently with her blood pressure medicines.  Occasionally she states she does not take them because it drops her blood pressure too much.  She denies fevers, chills or other complaints.  Past Medical History:  Diagnosis Date  . Arthritis    generalized  . Asthma   . GERD (gastroesophageal reflux disease)   . Headache    hx migraines  . Hypertension   . 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 Pre-diabetes     Patient Active Problem List   Diagnosis Date Noted  . Gastric ulcer 08/31/2016  . Hypertension 08/31/2016  . Sinus bradycardia 08/31/2016  . S/P total knee arthroplasty 08/31/2016  . Mild intermittent asthma without complication 11/15/2015  . GERD without esophagitis 03/26/2015  . Type 2 diabetes mellitus without complication (HCC) 03/26/2015  . DDD (degenerative disc disease), lumbar 10/11/2014  . Lumbar radiculitis 10/11/2014  . Primary osteoarthritis of both knees 10/11/2014  . DJD (degenerative joint disease) 02/13/2014    Past Surgical History:  Procedure Laterality Date  . KNEE ARTHROPLASTY Right 08/31/2016   Procedure: COMPUTER ASSISTED TOTAL KNEE ARTHROPLASTY;  Surgeon: Donato HeinzJames P Hooten, MD;  Location: [facility] ORS;  Service: Orthopedics;  Laterality: Right;  . LEFT HEART CATH AND CORONARY ANGIOGRAPHY Left 06/22/2017   Procedure: LEFT HEART CATH AND CORONARY ANGIOGRAPHY;  Surgeon:  Alwyn Peaallwood, Dwayne D, MD;  Location: [facility] INVASIVE CV LAB;  Service: Cardiovascular;  Laterality: Left;  . TUBAL LIGATION      Allergies Fluticasone-salmeterol; Celecoxib; Meloxicam; Oxycodone-acetaminophen; Cortisone; Tramadol; Doxycycline; and Rofecoxib  Social History Social History   Tobacco Use  . Smoking status: Former Smoker    Last attempt to quit: 08/19/1996    Years since quitting: 21.9  . Smokeless tobacco: Never Used  Substance Use Topics  . Alcohol use: No  . Drug use: No   Review of Systems Constitutional: Negative for fever. Cardiovascular: Negative for chest pain. Respiratory: Negative for shortness of breath. Gastrointestinal: Negative for abdominal pain, vomiting and diarrhea. Genitourinary: Negative for dysuria. Musculoskeletal: Negative for back pain. Skin: Negative for rash. Neurological: Positive for headache  All systems negative/normal/unremarkable except as stated in the HPI  ____________________________________________   PHYSICAL EXAM:  VITAL SIGNS: ED Triage Vitals  Enc Vitals Group     BP 07/29/18 1908 (!) 186/94     Pulse Rate 07/29/18 1908 63     Resp 07/29/18 1908 17     Temp 07/29/18 1908 98 F (36.7 C)     Temp Source 07/29/18 1908 Oral     SpO2 07/29/18 1908 99 %     Weight 07/29/18 1909 155 lb (70.3 kg)     Height 07/29/18 1909 5\' 1"  (1.549 m)     Head Circumference --      Peak Flow --      Pain Score 07/29/18 1909 10     Pain Loc --      Pain Edu? --      Excl. in GC? --  Constitutional: Alert and oriented. Well appearing and in no distress. Cardiovascular: Normal rate, regular rhythm. No murmurs, rubs, or gallops. Respiratory: Normal respiratory effort without tachypnea nor retractions. Breath sounds are clear and equal bilaterally. No wheezes/rales/rhonchi. Gastrointestinal: Soft and nontender. Normal bowel sounds Musculoskeletal: Nontender with normal range of motion in extremities. No lower extremity tenderness nor  edema. Neurologic:  Normal speech and language. No gross focal neurologic deficits are appreciated.  Skin:  Skin is warm, dry and intact. No rash noted. Psychiatric: Mood and affect are normal. Speech and behavior are normal.  ____________________________________________  ED COURSE:  As part of my medical decision making, I reviewed the following data within the electronic MEDICAL RECORD NUMBER History obtained from family if available, nursing notes, old chart and ekg, as well as notes from prior ED visits. Patient presented for hypertension, we will assess with labs and imaging as indicated at this time.   Procedures ____________________________________________   LABS (pertinent positives/negatives)  Labs Reviewed  CBC  COMPREHENSIVE METABOLIC PANEL  ___________________________________________  DIFFERENTIAL DIAGNOSIS   Hypertension, medical screening exam  FINAL ASSESSMENT AND PLAN  Hypertension   Plan: The patient had presented for elevated blood pressure. Patient's labs did not reveal any acute process.  She is encouraged to follow-up with her doctor in 1 week for recheck.Ulice Dash, MD   Note: This note was generated in part or whole with voice recognition software. Voice recognition is usually quite accurate but there are transcription errors that can and very often do occur. I apologize for any typographical errors that were not detected and corrected.     Emily Filbert, MD 07/29/18 2001

## 2018-07-29 NOTE — ED Notes (Signed)
FIRST NURSE NOTE:  Pt c/o elevated blood pressure since this morning, pt c/o dizziness and blurry vision, pt states she took her BP meds this morning.

## 2018-07-29 NOTE — ED Triage Notes (Signed)
Pt arrives to ED via POV from home with c/o hypertension x1 day. Pt reports BP at home this AM of 170/112. Pt reports compliance with her BP meds (HCTZ 25mg ), "but not every day, because it drops my blood pressure too much". Pt reports some headache, but denies N/V/D or fever. Pt is A&O, in NAD; RR even, regular, and unlabored.

## 2019-05-19 ENCOUNTER — Other Ambulatory Visit: Payer: Self-pay | Admitting: Otolaryngology

## 2019-05-19 DIAGNOSIS — R42 Dizziness and giddiness: Secondary | ICD-10-CM

## 2019-05-24 ENCOUNTER — Other Ambulatory Visit: Payer: Self-pay

## 2019-05-24 ENCOUNTER — Ambulatory Visit
Admission: RE | Admit: 2019-05-24 | Discharge: 2019-05-24 | Disposition: A | Discharge status: Home / Self Care | Payer: Medicare HMO | Source: Ambulatory Visit | Attending: Otolaryngology | Admitting: Otolaryngology

## 2019-05-24 DIAGNOSIS — R42 Dizziness and giddiness: Secondary | ICD-10-CM | POA: Insufficient documentation

## 2020-07-12 ENCOUNTER — Other Ambulatory Visit
Admission: RE | Admit: 2020-07-12 | Discharge: 2020-07-12 | Disposition: A | Discharge status: Home / Self Care | Payer: Medicare HMO | Source: Ambulatory Visit | Attending: Internal Medicine | Admitting: Internal Medicine

## 2020-07-12 DIAGNOSIS — M7989 Other specified soft tissue disorders: Secondary | ICD-10-CM | POA: Diagnosis present

## 2020-07-12 LAB — FIBRIN DERIVATIVES D-DIMER (ARMC ONLY): Fibrin derivatives D-dimer (ARMC): 607.69 ng/mL (FEU) — ABNORMAL HIGH (ref 0.00–499.00)

## 2020-07-23 ENCOUNTER — Other Ambulatory Visit: Payer: Self-pay

## 2020-07-23 ENCOUNTER — Emergency Department
Admission: EM | Admit: 2020-07-23 | Discharge: 2020-07-23 | Disposition: A | Discharge status: Home / Self Care | Payer: Medicare HMO | Attending: Student in an Organized Health Care Education/Training Program | Admitting: Student in an Organized Health Care Education/Training Program

## 2020-07-23 ENCOUNTER — Emergency Department: Payer: Medicare HMO

## 2020-07-23 DIAGNOSIS — J4520 Mild intermittent asthma, uncomplicated: Secondary | ICD-10-CM | POA: Diagnosis not present

## 2020-07-23 DIAGNOSIS — Z87891 Personal history of nicotine dependence: Secondary | ICD-10-CM | POA: Insufficient documentation

## 2020-07-23 DIAGNOSIS — Z7982 Long term (current) use of aspirin: Secondary | ICD-10-CM | POA: Diagnosis not present

## 2020-07-23 DIAGNOSIS — Z96651 Presence of right artificial knee joint: Secondary | ICD-10-CM | POA: Diagnosis not present

## 2020-07-23 DIAGNOSIS — Y92410 Unspecified street and highway as the place of occurrence of the external cause: Secondary | ICD-10-CM | POA: Insufficient documentation

## 2020-07-23 DIAGNOSIS — Z79899 Other long term (current) drug therapy: Secondary | ICD-10-CM | POA: Diagnosis not present

## 2020-07-23 DIAGNOSIS — M791 Myalgia, unspecified site: Secondary | ICD-10-CM | POA: Diagnosis not present

## 2020-07-23 DIAGNOSIS — M542 Cervicalgia: Secondary | ICD-10-CM | POA: Diagnosis not present

## 2020-07-23 DIAGNOSIS — E119 Type 2 diabetes mellitus without complications: Secondary | ICD-10-CM | POA: Diagnosis not present

## 2020-07-23 DIAGNOSIS — M7918 Myalgia, other site: Secondary | ICD-10-CM

## 2020-07-23 DIAGNOSIS — I10 Essential (primary) hypertension: Secondary | ICD-10-CM | POA: Diagnosis not present

## 2020-07-23 DIAGNOSIS — M546 Pain in thoracic spine: Secondary | ICD-10-CM | POA: Diagnosis present

## 2020-07-23 MED ORDER — CYCLOBENZAPRINE HCL 5 MG PO TABS: 5.0000 mg | ORAL_TABLET | Freq: Three times a day (TID) | ORAL | 0 refills | Status: AC | PRN

## 2020-07-23 MED ORDER — LIDOCAINE 5 % EX PTCH: 1.0000 | MEDICATED_PATCH | Freq: Two times a day (BID) | CUTANEOUS | 0 refills | Status: AC | PRN
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
## 2020-07-23 NOTE — ED Notes (Signed)
Pt to CT
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
## 2020-07-23 NOTE — ED Notes (Signed)
Pt ambulatory, full ROM all extremeties, States 8/10 pain L shoulder and side.
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
## 2020-11-05 ENCOUNTER — Other Ambulatory Visit: Payer: Self-pay | Admitting: Physician Assistant

## 2020-11-05 DIAGNOSIS — Z1231 Encounter for screening mammogram for malignant neoplasm of breast: Secondary | ICD-10-CM

## 2020-11-12 ENCOUNTER — Other Ambulatory Visit: Payer: Self-pay | Admitting: Physician Assistant

## 2020-11-12 DIAGNOSIS — R252 Cramp and spasm: Secondary | ICD-10-CM

## 2020-11-12 DIAGNOSIS — M79604 Pain in right leg: Secondary | ICD-10-CM

## 2020-11-28 ENCOUNTER — Other Ambulatory Visit: Payer: Self-pay

## 2020-11-28 ENCOUNTER — Ambulatory Visit
Admission: RE | Admit: 2020-11-28 | Discharge: 2020-11-28 | Disposition: A | Discharge status: Home / Self Care | Payer: Medicare HMO | Source: Ambulatory Visit | Attending: Physician Assistant | Admitting: Physician Assistant

## 2020-11-28 DIAGNOSIS — M79605 Pain in left leg: Secondary | ICD-10-CM | POA: Diagnosis present

## 2020-11-28 DIAGNOSIS — R252 Cramp and spasm: Secondary | ICD-10-CM | POA: Diagnosis not present

## 2020-11-28 DIAGNOSIS — M79604 Pain in right leg: Secondary | ICD-10-CM | POA: Insufficient documentation

## 2021-08-13 ENCOUNTER — Other Ambulatory Visit: Payer: Self-pay | Admitting: Physician Assistant

## 2021-08-13 DIAGNOSIS — M5412 Radiculopathy, cervical region: Secondary | ICD-10-CM

## 2021-08-13 DIAGNOSIS — M542 Cervicalgia: Secondary | ICD-10-CM

## 2021-08-27 ENCOUNTER — Ambulatory Visit
Admission: RE | Admit: 2021-08-27 | Discharge: 2021-08-27 | Disposition: A | Discharge status: Home / Self Care | Payer: Medicare HMO | Source: Ambulatory Visit | Attending: Physician Assistant | Admitting: Physician Assistant

## 2021-08-27 DIAGNOSIS — M542 Cervicalgia: Secondary | ICD-10-CM | POA: Diagnosis present

## 2021-08-27 DIAGNOSIS — M5412 Radiculopathy, cervical region: Secondary | ICD-10-CM | POA: Insufficient documentation

## 2021-11-05 ENCOUNTER — Other Ambulatory Visit: Payer: Self-pay | Admitting: Physician Assistant

## 2021-11-05 DIAGNOSIS — Z1231 Encounter for screening mammogram for malignant neoplasm of breast: Secondary | ICD-10-CM

## 2022-02-04 ENCOUNTER — Other Ambulatory Visit: Payer: Self-pay | Admitting: Neurological Surgery

## 2022-02-04 DIAGNOSIS — R1310 Dysphagia, unspecified: Secondary | ICD-10-CM

## 2022-02-16 ENCOUNTER — Ambulatory Visit
Admission: RE | Admit: 2022-02-16 | Discharge: 2022-02-16 | Disposition: A | Discharge status: Home / Self Care | Payer: Medicare HMO | Source: Ambulatory Visit | Attending: Neurological Surgery | Admitting: Neurological Surgery

## 2022-02-16 DIAGNOSIS — R1310 Dysphagia, unspecified: Secondary | ICD-10-CM | POA: Diagnosis present

## 2022-02-16 NOTE — Therapy (Signed)
[facility] [place], [place], [address] Phone: [phone]   Fax:     Modified Barium Swallow  Patient Details  Name: Michele Jefferson MRN: [id number] Date of Birth: September 19, 1950 No data recorded  Encounter Date: 02/16/2022   End of Session - 02/16/22 1645     Visit Number 1    Number of Visits 1    Date for SLP Re-Evaluation 02/16/22    SLP Start Time 1245    SLP Stop Time  1315    SLP Time Calculation (min) 30 min    Activity Tolerance Patient tolerated treatment well             Past Medical History:  Diagnosis Date   Arthritis    generalized   Asthma    GERD (gastroesophageal reflux disease)    Headache    hx migraines   Hypertension    Increased pressure in the eye, bilateral 1964   Pre-diabetes     Past Surgical History:  Procedure Laterality Date   KNEE ARTHROPLASTY Right 08/31/2016   Procedure: COMPUTER ASSISTED TOTAL KNEE ARTHROPLASTY;  Surgeon: Dereck Leep, MD;  Location: [facility] ORS;  Service: Orthopedics;  Laterality: Right;   LEFT HEART CATH AND CORONARY ANGIOGRAPHY Left 06/22/2017   Procedure: LEFT HEART CATH AND CORONARY ANGIOGRAPHY;  Surgeon: Yolonda Kida, MD;  Location: [facility];  Service: Cardiovascular;  Laterality: Left;   TUBAL LIGATION      There were no vitals filed for this visit.   Subjective Assessment - 02/16/22 1522     Subjective "I have this phlegm all the time."    Currently in Pain? No/denies              02/16/22 1500  SLP Visit Information  SLP Received On 02/16/22  Subjective  Subjective pt reports globus sensation, no difficulty swallowing except for larger pills  Patient/Family Stated Goal figure out why she has phlegm  Pain Assessment  Pain Assessment No/denies pain  General Information  Date of Onset 02/16/22 (documented dx swallowing complaints back to 2016)  HPI Patient is a 71 y.o. female referred by neurosurgery for  MBS, with past medical history including DJD, GERD, gastric ulcer, HTN, asthma, DM II.  Type of Study MBS-Modified Barium Swallow Study  Diet Prior to this Study Regular;Thin liquids  Temperature Spikes Noted No  Respiratory Status Room air  Behavior/Cognition Alert;Cooperative;Pleasant mood  Oral Cavity Assessment WFL  Oral Care Completed by SLP No  Oral Cavity - Dentition Missing dentition;Dentures, not available (does not wear dentures to eat)  Vision Functional for self feeding  Self-Feeding Abilities Able to feed self  Patient Positioning Upright in chair  Baseline Vocal Quality Normal  Volitional Cough Strong  Volitional Swallow Able to elicit  Anatomy [facility]  Pharyngeal Secretions Not observed secondary MBS  Oral Motor/Sensory Function  Overall Oral Motor/Sensory Function WFL  Oral Preparation/Oral Phase  Oral Phase WFL  Pharyngeal Phase  Pharyngeal Phase WFL  Cervical Esophageal Phase  Cervical Esophageal Phase Impaired  Cervical Esophageal Phase - Pudding  Pudding Teaspoon Other (Comment) (appearance of slow clearance through the cervical esophagus)  Cervical Esophageal Phase - Solids  Regular Esophageal backflow into cervical esophagus  Clinical Impression  Clinical Impression Patient presents with normal oropharyngeal swallow and suspected primary esophageal dysphagia. Oral stage is characterized by adequate lip closure, bolus preparation, and anterior to posterior transit. Swallow initiation is timely at the level of the base  of tongue. Pharyngeal stage is noted for adequate tongue base retraction, hyolaryngeal excursion, and pharyngeal constriction. Epiglottic deflection is complete; there is no penetration or aspiration. Pharyngeal stripping wave is complete with no abnormal pharyngeal residue. Duration of pharyngoesophageal segment opening is WFL. Appearance of anterior bony protrusions region of C5-6 and C6-7; this did not appear to significantly impact bolus flow or  clearance, although clearance appeared slower (but adequate) for solids. An esophageal sweep in the upright position was performed when pt had delayed cough after solid, during which there was retrograde movement of the bolus into the cervical esophagus, as well as stasis of barium in the thoracic esophagus. Patient reported difficulty swallowing tablet; she leaned out of frame when 13 mm tablet was administered, however subsequent scan of the pharynx, proximal and thoracic esophagus was clear. SLP educated on reflux modifications and provided handout with this information. Patient would benefit from GI referral/ further assessment of esophageal function. Recommend patient continue regular diet with thin liquids; no further ST indicated at this time.  SLP Visit Diagnosis Dysphagia, pharyngoesophageal phase (R13.14)  Impact on safety and function Mild aspiration risk  Swallow Evaluation Recommendations  Recommended Consults Consider GI evaluation;Consider esophageal assessment  SLP Diet Recommendations Regular solids;Thin liquid  Liquid Administration via Cup  Medication Administration Whole meds with liquid  Supervision Patient able to self feed  Compensations Slow rate;Small sips/bites;Follow solids with liquid  Postural Changes Remain semi-upright after after feeds/meals (Comment);Seated upright at 90 degrees  Treatment Plan  Oral Care Recommendations Oral care BID  Treatment Recommendations No treatment recommended at this time  Follow Up Recommendations No SLP follow up  Individuals Consulted  Consulted and Agree with Results and Recommendations Patient  Report Sent to  Referring physician  Progression Toward Goals  Progression toward goals Goals met, education completed, patient discharged from SLP  SLP Time Calculation  SLP Start Time (ACUTE ONLY) 1245  SLP Stop Time (ACUTE ONLY) 1315  SLP Time Calculation (min) (ACUTE ONLY) 30 min  SLP Evaluations  $ SLP Speech Visit 1 Visit  SLP  Evaluations  $Outpatient MBS Swallow 1 Procedure    Dysphagia, unspecified type - Plan: DG SWALLOW FUNC OP MEDICARE SPEECH PATH, DG SWALLOW FUNC OP MEDICARE SPEECH PATH        Problem List Patient Active Problem List   Diagnosis Date Noted   Gastric ulcer 08/31/2016   Hypertension 08/31/2016   Sinus bradycardia 08/31/2016   S/P total knee arthroplasty 08/31/2016   Mild intermittent asthma without complication 54/65/6812   GERD without esophagitis 03/26/2015   Type 2 diabetes mellitus without complication ([place]) 75/17/0017   DDD (degenerative disc disease), lumbar 10/11/2014   Lumbar radiculitis 10/11/2014   Primary osteoarthritis of both knees 10/11/2014   DJD (degenerative joint disease) 02/13/2014   Deneise Lever, MS, CCC-SLP Speech-Language Pathologist [phone]  Aliene Altes, [place] 02/16/2022, 4:45 PM  [facility] [place], [place], [address] Phone: [phone]   Fax:     Name: MILEY BLANCHETT MRN: [id number] Date of Birth: 1951-06-30

## 2022-02-17 ENCOUNTER — Ambulatory Visit
Admission: RE | Admit: 2022-02-17 | Discharge: 2022-02-17 | Disposition: A | Discharge status: Home / Self Care | Payer: Medicare HMO | Source: Ambulatory Visit | Attending: Physician Assistant | Admitting: Physician Assistant

## 2022-02-17 DIAGNOSIS — Z1231 Encounter for screening mammogram for malignant neoplasm of breast: Secondary | ICD-10-CM | POA: Insufficient documentation

## 2022-04-19 ENCOUNTER — Encounter: Payer: Self-pay | Admitting: Gastroenterology

## 2022-04-20 ENCOUNTER — Encounter: Admission: RE | Payer: Self-pay | Source: Ambulatory Visit

## 2022-04-20 ENCOUNTER — Encounter: Payer: Self-pay | Admitting: General Practice

## 2022-04-20 ENCOUNTER — Ambulatory Visit: Admission: RE | Admit: 2022-04-20 | Payer: Medicare HMO | Source: Ambulatory Visit | Admitting: Gastroenterology

## 2022-04-20 SURGERY — COLONOSCOPY
Anesthesia: General

## 2022-04-20 MED ORDER — PROPOFOL 10 MG/ML IV BOLUS
INTRAVENOUS | Status: AC
  Filled 2022-04-20: qty 20

## 2022-07-22 ENCOUNTER — Ambulatory Visit: Admit: 2022-07-22 | Payer: No Typology Code available for payment source | Admitting: Internal Medicine

## 2022-07-22 SURGERY — COLONOSCOPY
Anesthesia: General

## 2022-10-15 ENCOUNTER — Encounter: Admission: RE | Payer: Self-pay | Source: Ambulatory Visit

## 2022-10-15 ENCOUNTER — Ambulatory Visit: Admission: RE | Admit: 2022-10-15 | Payer: Medicare HMO | Source: Ambulatory Visit | Admitting: Gastroenterology

## 2022-10-15 SURGERY — COLONOSCOPY
Anesthesia: General

## 2023-01-15 ENCOUNTER — Other Ambulatory Visit: Payer: Self-pay | Admitting: Otolaryngology

## 2023-01-15 DIAGNOSIS — R42 Dizziness and giddiness: Secondary | ICD-10-CM

## 2023-02-02 ENCOUNTER — Ambulatory Visit
Admission: RE | Admit: 2023-02-02 | Discharge: 2023-02-02 | Disposition: A | Discharge status: Home / Self Care | Payer: Medicare HMO | Source: Ambulatory Visit | Attending: Otolaryngology | Admitting: Otolaryngology

## 2023-02-02 DIAGNOSIS — R42 Dizziness and giddiness: Secondary | ICD-10-CM

## 2023-03-29 IMAGING — MG MM DIGITAL SCREENING BILAT W/ TOMO AND CAD
8 series · 8 of 24 positions shown · non-contrast
Comparison: Previous exam(s).

CLINICAL DATA: Screening.

EXAM:
DIGITAL SCREENING BILATERAL MAMMOGRAM WITH TOMOSYNTHESIS AND CAD
TECHNIQUE: Bilateral screening digital craniocaudal and mediolateral oblique
mammograms were obtained. Bilateral screening digital breast
tomosynthesis was performed. The images were evaluated with
computer-aided detection.

[R MLO synth-2D]
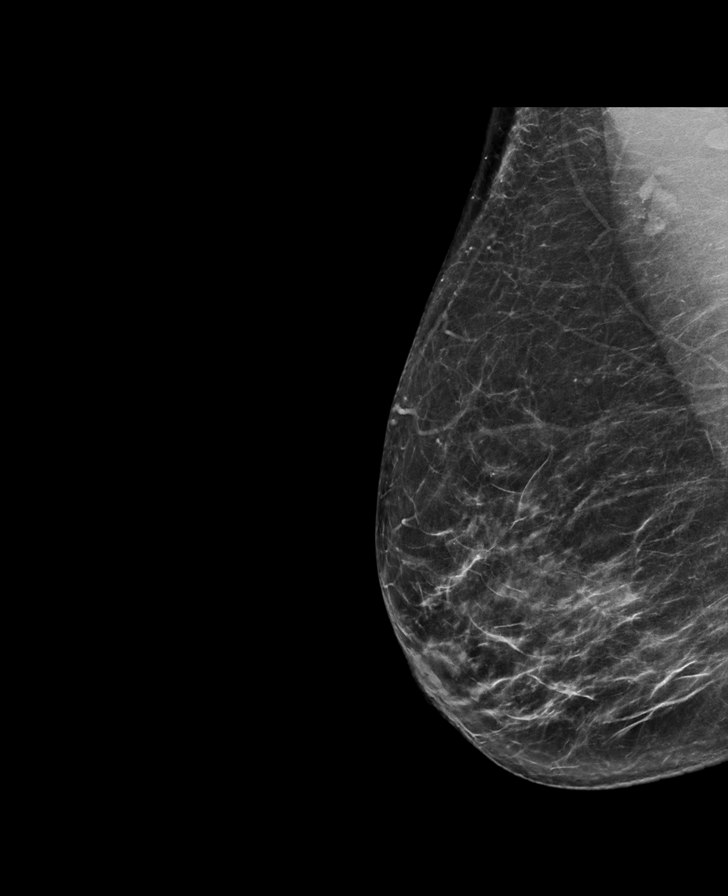

[L MLO synth-2D]
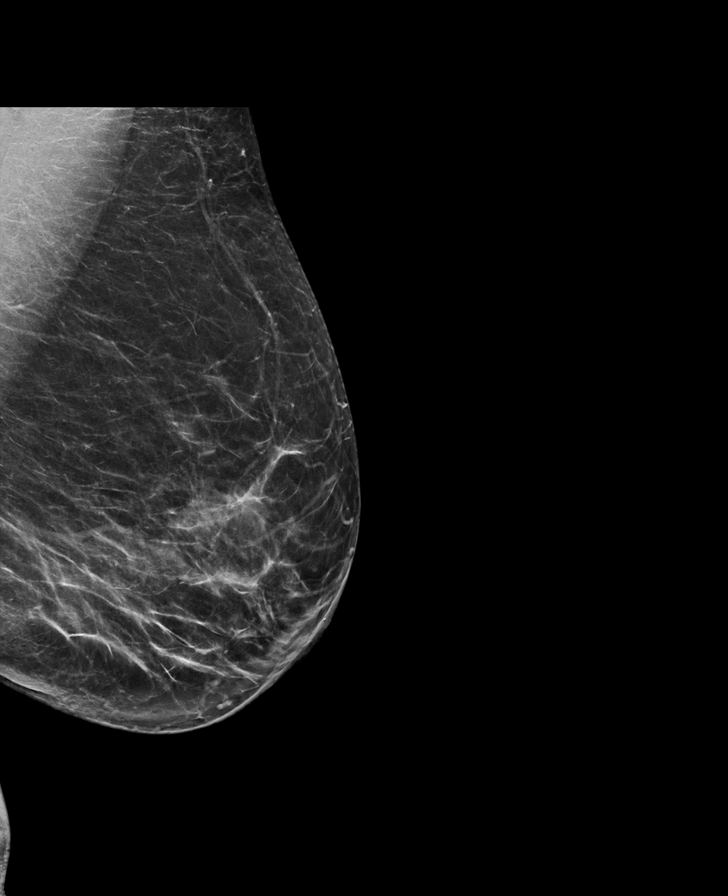

[L CC synth-2D]
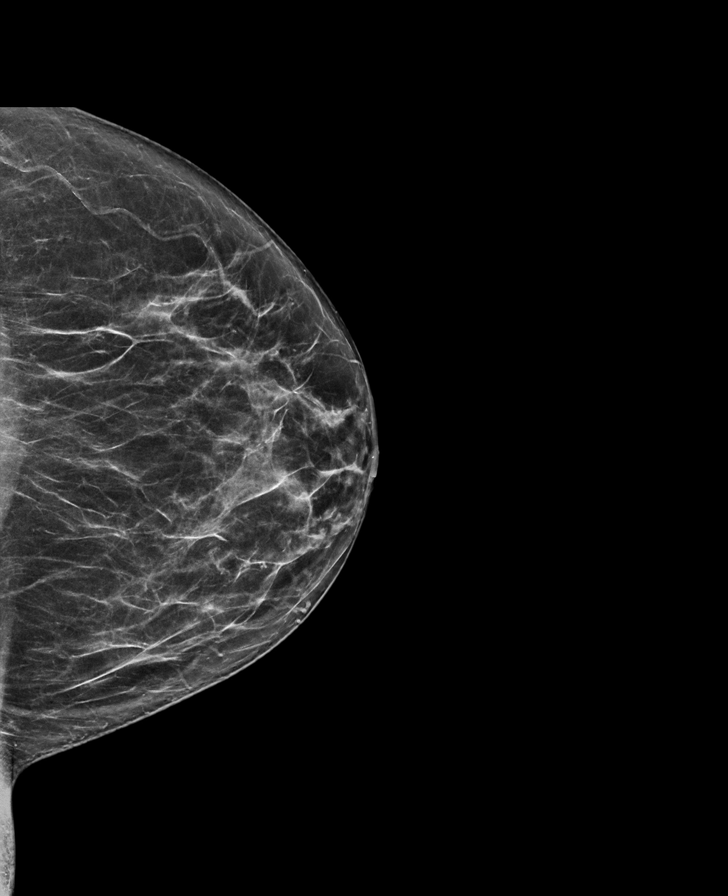

[R CC synth-2D]
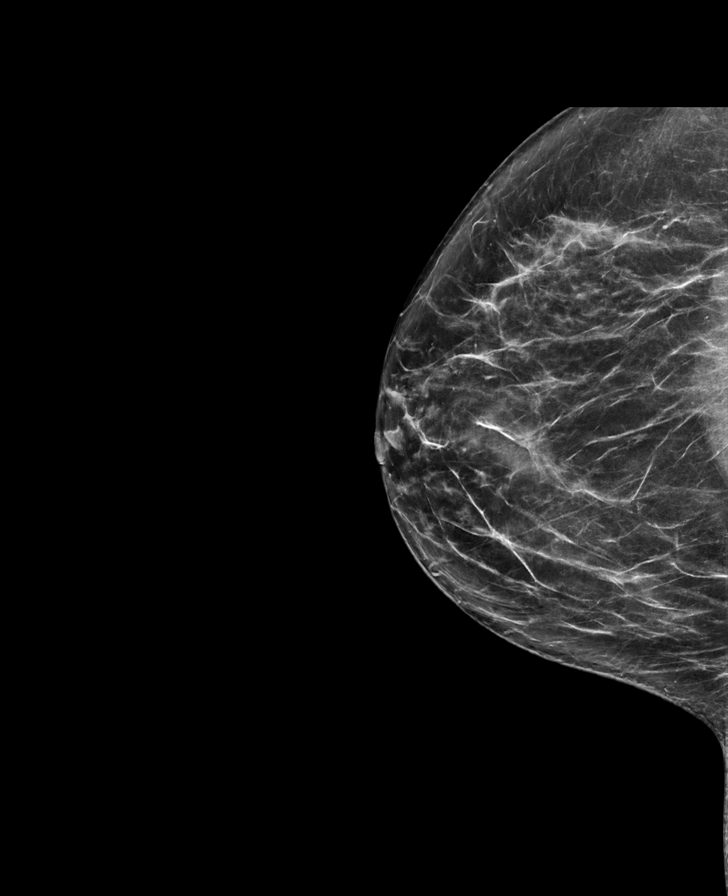

[L MLO tomo · tomo slice 37/74.0]
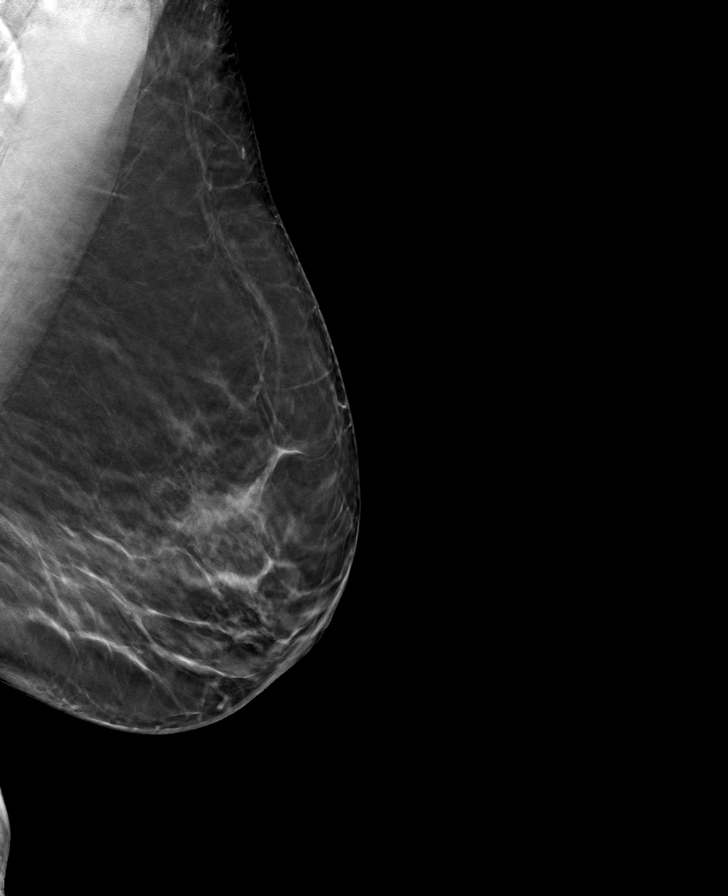

[R MLO tomo · tomo slice 39/76.0]
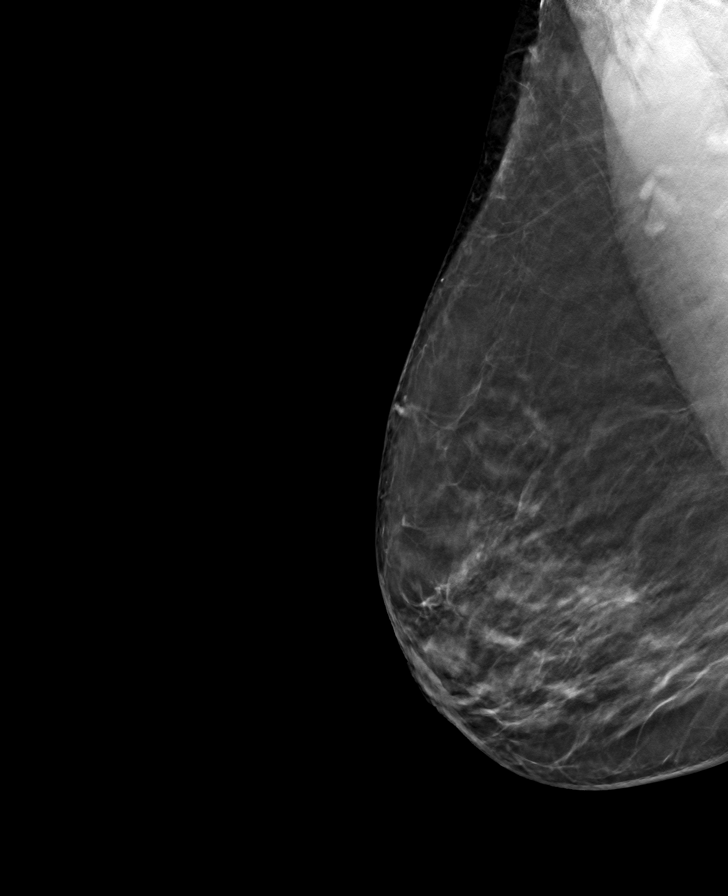

[L CC tomo · tomo slice 35/68.0]
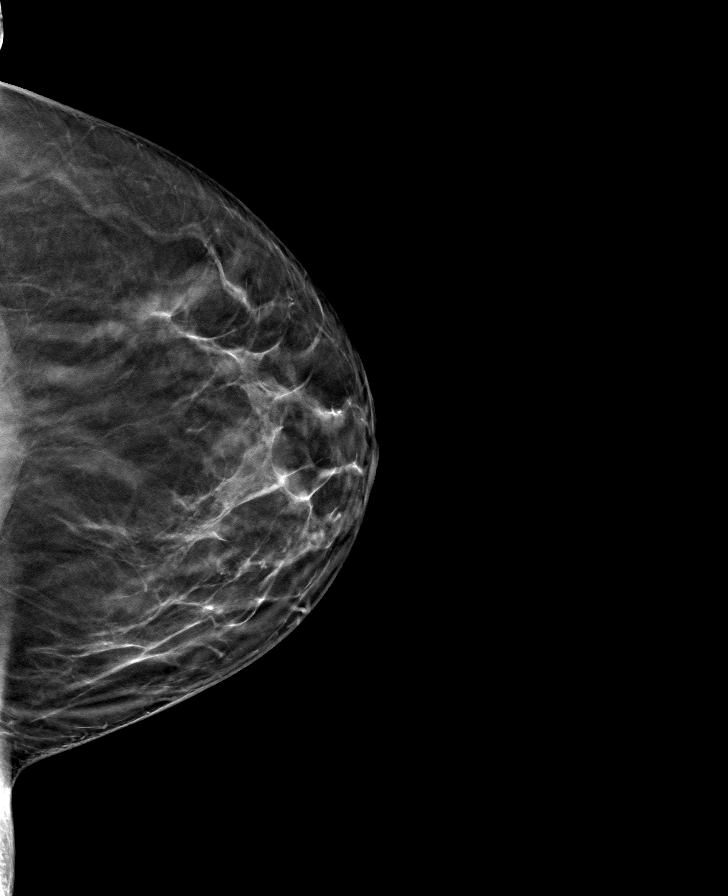

[R CC tomo · tomo slice 37/73.0]
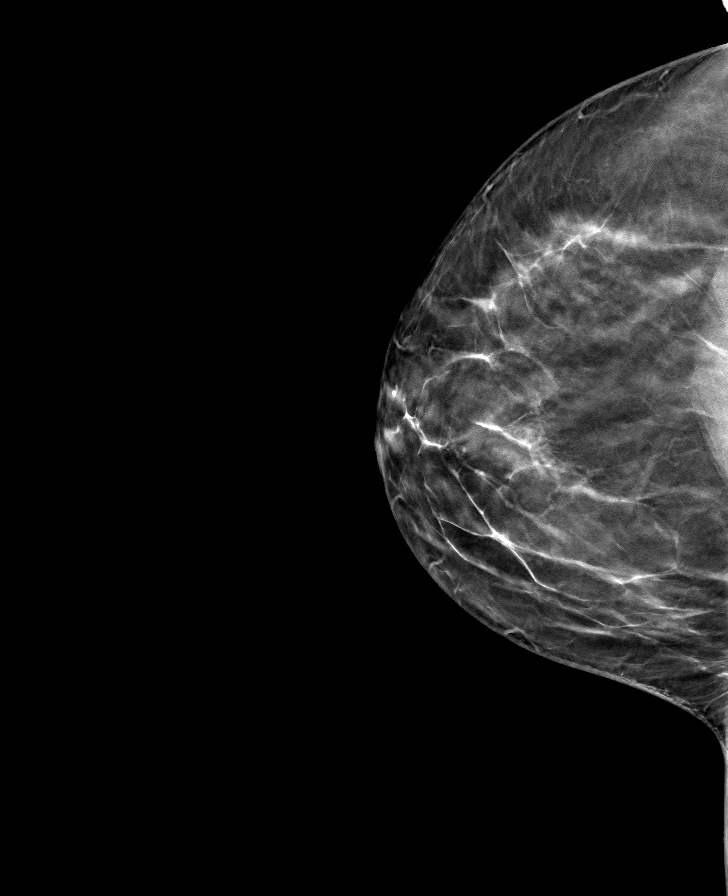

[8 of 24 positions shown; findings below may reference images not displayed]

ACR Breast Density Category c: The breast tissue is heterogeneously
dense, which may obscure small masses.
FINDINGS: There are no findings suspicious for malignancy.
IMPRESSION: No mammographic evidence of malignancy. A result letter of this
screening mammogram will be mailed directly to the patient.

RECOMMENDATION:
Screening mammogram in one year. (Code:[id number]-[id number])

BI-RADS CATEGORY  1: Negative.

## 2023-05-05 ENCOUNTER — Other Ambulatory Visit: Payer: Self-pay | Admitting: Physician Assistant

## 2023-05-05 DIAGNOSIS — M5416 Radiculopathy, lumbar region: Secondary | ICD-10-CM

## 2023-05-18 ENCOUNTER — Encounter: Payer: Self-pay | Admitting: Physician Assistant

## 2023-05-20 ENCOUNTER — Other Ambulatory Visit: Payer: Self-pay | Admitting: Physician Assistant

## 2023-05-20 DIAGNOSIS — Z1231 Encounter for screening mammogram for malignant neoplasm of breast: Secondary | ICD-10-CM

## 2023-05-26 ENCOUNTER — Ambulatory Visit
Admission: RE | Admit: 2023-05-26 | Discharge: 2023-05-26 | Disposition: A | Discharge status: Home / Self Care | Payer: Medicare HMO | Source: Ambulatory Visit | Attending: Physician Assistant | Admitting: Physician Assistant

## 2023-05-26 DIAGNOSIS — M5416 Radiculopathy, lumbar region: Secondary | ICD-10-CM

## 2023-06-01 ENCOUNTER — Ambulatory Visit
Admission: RE | Admit: 2023-06-01 | Discharge: 2023-06-01 | Disposition: A | Discharge status: Home / Self Care | Payer: Medicare HMO | Source: Ambulatory Visit | Attending: Physician Assistant | Admitting: Physician Assistant

## 2023-06-01 DIAGNOSIS — Z1231 Encounter for screening mammogram for malignant neoplasm of breast: Secondary | ICD-10-CM | POA: Diagnosis present

## 2023-10-13 ENCOUNTER — Emergency Department: Payer: No Typology Code available for payment source

## 2023-10-13 ENCOUNTER — Other Ambulatory Visit: Payer: Self-pay

## 2023-10-13 DIAGNOSIS — E119 Type 2 diabetes mellitus without complications: Secondary | ICD-10-CM | POA: Diagnosis not present

## 2023-10-13 DIAGNOSIS — I10 Essential (primary) hypertension: Secondary | ICD-10-CM | POA: Insufficient documentation

## 2023-10-13 DIAGNOSIS — R0789 Other chest pain: Secondary | ICD-10-CM | POA: Diagnosis present

## 2023-10-13 LAB — CBC
HCT: 42.4 % (ref 36.0–46.0)
Hemoglobin: 13.4 g/dL (ref 12.0–15.0)
MCH: 26.4 pg (ref 26.0–34.0)
MCHC: 31.6 g/dL (ref 30.0–36.0)
MCV: 83.6 fL (ref 80.0–100.0)
Platelets: 270 10*3/uL (ref 150–400)
RBC: 5.07 MIL/uL (ref 3.87–5.11)
RDW: 15.1 % (ref 11.5–15.5)
WBC: 6.9 10*3/uL (ref 4.0–10.5)
nRBC: 0 % (ref 0.0–0.2)

## 2023-10-13 LAB — BASIC METABOLIC PANEL
Anion gap: 11 (ref 5–15)
BUN: 21 mg/dL (ref 8–23)
CO2: 26 mmol/L (ref 22–32)
Calcium: 9 mg/dL (ref 8.9–10.3)
Chloride: 103 mmol/L (ref 98–111)
Creatinine, Ser: 0.95 mg/dL (ref 0.44–1.00)
GFR, Estimated: >60 mL/min (ref >60)
Glucose, Bld: 101 mg/dL — ABNORMAL HIGH (ref 70–99)
Potassium: 3.6 mmol/L (ref 3.5–5.1)
Sodium: 140 mmol/L (ref 135–145)

## 2023-10-13 LAB — TROPONIN I (HIGH SENSITIVITY): Troponin I (High Sensitivity): 6 ng/L (ref <18)

## 2023-10-13 NOTE — ED Triage Notes (Signed)
Pt reports chest pain that began 2 hours ago radiating into her right arm, pt states pain is worse when she tries to lift her arms. Pt denies cardiac hx.

## 2023-10-14 ENCOUNTER — Emergency Department
Admission: EM | Admit: 2023-10-14 | Discharge: 2023-10-14 | Disposition: A | Discharge status: Home / Self Care | Payer: Medicare HMO | Attending: Emergency Medicine | Admitting: Emergency Medicine

## 2023-10-14 DIAGNOSIS — R0789 Other chest pain: Secondary | ICD-10-CM

## 2023-10-14 DIAGNOSIS — I10 Essential (primary) hypertension: Secondary | ICD-10-CM

## 2023-10-14 LAB — TROPONIN I (HIGH SENSITIVITY): Troponin I (High Sensitivity): 6 ng/L (ref <18)

## 2023-10-14 NOTE — ED Provider Notes (Signed)
[facility] Provider Note    Event Date/Time   First MD Initiated Contact with Patient 10/14/23 [phone]     (approximate)   History   Chest Pain   HPI Michele Jefferson is a 73 y.o. female with no history of cardiac disease but history of hypertension and diabetes.  She presents tonight for evaluation of right-sided chest pain that started several hours prior to arrival.  She said that her chest aches a little bit at baseline but mostly hurts when she moves her right arm or when she pushes on her chest wall.  Also hurts if she coughs.  No shortness of breath.  No nausea or vomiting.  No weakness or numbness in her extremities.  She does not remember any specific trauma or injury that she may have sustained.  Of note, the patient says that she was taken off of her blood pressure medicine several months ago because it dropped her potassium too low.     Physical Exam   Triage Vital Signs: ED Triage Vitals  Encounter Vitals Group     BP 10/13/23 2254 (!) 204/97     Systolic BP Percentile --      Diastolic BP Percentile --      Pulse Rate 10/13/23 2254 73     Resp 10/13/23 2254 18     Temp 10/13/23 2254 97.6 F (36.4 C)     Temp Source 10/13/23 2254 Oral     SpO2 10/13/23 2254 96 %     Weight 10/13/23 2255 60.3 kg (133 lb)     Height 10/13/23 2255 1.575 m (5\' 2" )     Head Circumference --      Peak Flow --      Pain Score 10/13/23 2258 9     Pain Loc --      Pain Education --      Exclude from Growth Chart --     Most recent vital signs: Vitals:   10/13/23 2254 10/14/23 0054  BP: (!) 204/97 (!) 194/106  Pulse: 73 64  Resp: 18 18  Temp: 97.6 F (36.4 C) 98.1 F (36.7 C)  SpO2: 96% 96%    General: Awake, no distress.  Generally well-appearing, alert and oriented and conversant. CV:  Good peripheral perfusion.  Regular rate and rhythm.  Normal heart sounds. Resp:  Normal effort. Speaking easily and comfortably, no accessory muscle usage nor  intercostal retractions.  Lungs clear to auscultation. Abd:  No distention.  She has no tenderness to palpation of her abdomen including in the right upper quadrant with a negative Murphy sign.  No guarding or rebound. Other:  Highly reproducible right-sided chest wall tenderness to palpation.  She also has easily reproducible chest wall pain with range of motion of right shoulder.   ED Results / Procedures / Treatments   Labs (all labs ordered are listed, but only abnormal results are displayed) Labs Reviewed  BASIC METABOLIC PANEL - Abnormal; Notable for the following components:      Result Value   Glucose, Bld 101 (*)    All other components within normal limits  CBC  TROPONIN I (HIGH SENSITIVITY)  TROPONIN I (HIGH SENSITIVITY)     EKG  ED ECG REPORT I, Loleta Rose, the attending physician, personally viewed and interpreted this ECG.  Date: 10/13/2023 EKG Time: 22:49 Rate: 71 Rhythm: normal sinus rhythm QRS Axis: normal Intervals: normal ST/T Wave abnormalities: normal Narrative Interpretation: no evidence of acute ischemia  RADIOLOGY I viewed and interpreted the patient's two-view chest x-ray.   PROCEDURES:  Critical Care performed: No  Procedures    IMPRESSION / MDM / ASSESSMENT AND PLAN / ED COURSE  I reviewed the triage vital signs and the nursing notes.                              Differential diagnosis includes, but is not limited to, musculoskeletal strain, costochondritis, ACS, PE, pneumonia, pneumothorax  Patient's presentation is most consistent with acute presentation with potential threat to life or bodily function.  Labs/studies ordered: EKG, two-view chest x-ray, CBC, basic metabolic panel, high-sensitivity troponin x 2  Interventions/Medications given:  Medications - No data to display  (Note:  hospital course my include additional interventions and/or labs/studies not listed above.)   Patient's vital signs are notable for  uncontrolled hypertension but otherwise are normal.  Nonischemic EKG, normal chest x-ray, labs all within normal limits including high-sensitivity troponin.  Physical exam is notable for highly reproducible chest wall pain consistent with musculoskeletal strain.  I talked with her about all of these results and she wants to follow-up with her primary care doctor.  I think that is very reasonable and appropriate given the low probability of ACS.  I considered hospitalization initially but given the physical exam that is highly consistent with musculoskeletal pain, I do not think it is necessary.  I talked with her about her blood pressure as well and we had my usual and customary uncontrolled hypertension discussion.  I offered twice to write her prescription for amlodipine but she prefers to follow-up with her PCP which I also think is reasonable and appropriate and she promises she will do so soon.  I gave my usual and customary return precautions.      FINAL CLINICAL IMPRESSION(S) / ED DIAGNOSES   Final diagnoses:  Chest wall pain  Uncontrolled hypertension     Rx / DC Orders   ED Discharge Orders     None        Note:  This document was prepared using Dragon voice recognition software and may include unintentional dictation errors.   Loleta Rose, MD 10/14/23 [phone]

## 2023-10-14 NOTE — Discharge Instructions (Signed)
You have been seen in the Emergency Department (ED) today for chest pain.  As we have discussed today's test results are normal, and we believe your pain is due to pain/strain and/or inflammation of the muscles and/or cartilage of your chest wall.  We recommend you take ibuprofen 600 mg three times a day with meals for the next 5 days (unless you have been told previously not to take ibuprofen or NSAIDs in general).  You may also take Tylenol according to the label instructions.  Read through the included information for additional treatment recommendations and precautions.  Additionally, your blood pressure is very elevated today.  I offered to write your prescription for blood pressure medicine, but you prefer to follow-up with your primary care doctor.  This is very reasonable and appropriate, but please follow-up with the next available opportunity to discuss what medication might be good for you and your blood pressure management.  Continue to take your regular medications.   Return to the Emergency Department (ED) if you experience any further chest pain/pressure/tightness, difficulty breathing, or sudden sweating, or other symptoms that concern you.

## 2023-11-20 ENCOUNTER — Emergency Department

## 2023-11-20 ENCOUNTER — Emergency Department
Admission: EM | Admit: 2023-11-20 | Discharge: 2023-11-20 | Disposition: A | Discharge status: Home / Self Care | Attending: Student in an Organized Health Care Education/Training Program | Admitting: Student in an Organized Health Care Education/Training Program

## 2023-11-20 ENCOUNTER — Other Ambulatory Visit: Payer: Self-pay

## 2023-11-20 DIAGNOSIS — R42 Dizziness and giddiness: Secondary | ICD-10-CM

## 2023-11-20 DIAGNOSIS — I159 Secondary hypertension, unspecified: Secondary | ICD-10-CM

## 2023-11-20 DIAGNOSIS — I10 Essential (primary) hypertension: Secondary | ICD-10-CM | POA: Diagnosis not present

## 2023-11-20 LAB — CBC WITH DIFFERENTIAL/PLATELET
Abs Immature Granulocytes: 0.01 10*3/uL (ref 0.00–0.07)
Basophils Absolute: 0 10*3/uL (ref 0.0–0.1)
Basophils Relative: 1 %
Eosinophils Absolute: 0.1 10*3/uL (ref 0.0–0.5)
Eosinophils Relative: 3 %
HCT: 42.4 % (ref 36.0–46.0)
Hemoglobin: 13.7 g/dL (ref 12.0–15.0)
Immature Granulocytes: 0 %
Lymphocytes Relative: 32 %
Lymphs Abs: 1.7 10*3/uL (ref 0.7–4.0)
MCH: 27.2 pg (ref 26.0–34.0)
MCHC: 32.3 g/dL (ref 30.0–36.0)
MCV: 84.1 fL (ref 80.0–100.0)
Monocytes Absolute: 0.4 10*3/uL (ref 0.1–1.0)
Monocytes Relative: 8 %
Neutro Abs: 3 10*3/uL (ref 1.7–7.7)
Neutrophils Relative %: 56 %
Platelets: 300 10*3/uL (ref 150–400)
RBC: 5.04 MIL/uL (ref 3.87–5.11)
RDW: 14.7 % (ref 11.5–15.5)
WBC: 5.2 10*3/uL (ref 4.0–10.5)
nRBC: 0 % (ref 0.0–0.2)

## 2023-11-20 LAB — BASIC METABOLIC PANEL
Anion gap: 7 (ref 5–15)
BUN: 15 mg/dL (ref 8–23)
CO2: 26 mmol/L (ref 22–32)
Calcium: 9.1 mg/dL (ref 8.9–10.3)
Chloride: 105 mmol/L (ref 98–111)
Creatinine, Ser: 0.69 mg/dL (ref 0.44–1.00)
GFR, Estimated: >60 mL/min (ref >60)
Glucose, Bld: 107 mg/dL — ABNORMAL HIGH (ref 70–99)
Potassium: 4.2 mmol/L (ref 3.5–5.1)
Sodium: 138 mmol/L (ref 135–145)

## 2023-11-20 LAB — TROPONIN I (HIGH SENSITIVITY)
Troponin I (High Sensitivity): 3 ng/L (ref <18)
Troponin I (High Sensitivity): 5 ng/L (ref <18)

## 2023-11-20 MED ORDER — AMLODIPINE BESYLATE 5 MG PO TABS
5.0000 mg | ORAL_TABLET | Freq: Every day | ORAL | Status: DC
  Administered 2023-11-20: 5 mg via ORAL
  Filled 2023-11-20: qty 1

## 2023-11-20 MED ORDER — LORAZEPAM 0.5 MG PO TABS
0.5000 mg | ORAL_TABLET | Freq: Once | ORAL | Status: AC
  Administered 2023-11-20: 0.5 mg via ORAL
  Filled 2023-11-20: qty 1

## 2023-11-20 NOTE — ED Triage Notes (Signed)
 Pt states BP 213/115 this morning. Started new BP pill (losartan 25mg ) 2 days ago. Pt states BP has been increasing since starting new BP medicine. States she took 2  tablets this morning. Is complaining of blurred vision and dizziness x 2days.

## 2023-11-20 NOTE — ED Notes (Signed)
Transported to MRI by MRI tech

## 2023-11-20 NOTE — ED Notes (Signed)
 Assisted patient to bathroom with 1 person assist. Patient does report increased blurry vision and is concerned for her blood pressure again. Dr. Roxan Hockey notified.

## 2023-11-20 NOTE — ED Notes (Signed)
 Patient back in room from MRI.

## 2023-11-20 NOTE — ED Provider Notes (Signed)
 [facility] Provider Note    Event Date/Time   First MD Initiated Contact with Patient 11/20/23 1015     (approximate)   History   Hypertension   HPI  Michele Jefferson is a 73 y.o. female who presents to the ER for evaluation of blurred vision and dizziness for the past 2 days.  Has been off blood pressure medications and was recently started on losartan 25 mg 2 days ago.  States that she took 2 of those this morning due to her blood pressure being high.  She denies any weakness.  Denies any headaches.  No chest pain or shortness of breath.     Physical Exam   Triage Vital Signs: ED Triage Vitals  Encounter Vitals Group     BP 11/20/23 0944 (!) 220/101     Systolic BP Percentile --      Diastolic BP Percentile --      Pulse Rate 11/20/23 0944 81     Resp 11/20/23 0944 20     Temp 11/20/23 0944 97.9 F (36.6 C)     Temp Source 11/20/23 0944 Oral     SpO2 11/20/23 0944 98 %     Weight 11/20/23 0945 147 lb (66.7 kg)     Height 11/20/23 0945 5\' 2"  (1.575 m)     Head Circumference --      Peak Flow --      Pain Score 11/20/23 0944 0     Pain Loc --      Pain Education --      Exclude from Growth Chart --     Most recent vital signs: Vitals:   11/20/23 1308 11/20/23 1309  BP: (!) 188/101   Pulse: 72   Resp: 15   Temp:    SpO2: 96% 96%     Constitutional: Alert  Eyes: Conjunctivae are normal.  Head: Atraumatic. Nose: No congestion/rhinnorhea. Mouth/Throat: Mucous membranes are moist.   Neck: Painless ROM.  Cardiovascular:   Good peripheral circulation. Respiratory: Normal respiratory effort.  No retractions.  Gastrointestinal: Soft and nontender.  Musculoskeletal:  no deformity Neurologic:  MAE spontaneously. Trace drift in rue, strneght and sensation intact throughout Skin:  Skin is warm, dry and intact. No rash noted. Psychiatric: Mood and affect are normal. Speech and behavior are normal.    ED Results / Procedures /  Treatments   Labs (all labs ordered are listed, but only abnormal results are displayed) Labs Reviewed  BASIC METABOLIC PANEL - Abnormal; Notable for the following components:      Result Value   Glucose, Bld 107 (*)    All other components within normal limits  CBC WITH DIFFERENTIAL/PLATELET  TROPONIN I (HIGH SENSITIVITY)  TROPONIN I (HIGH SENSITIVITY)     EKG  ED ECG REPORT I, Willy Eddy, the attending physician, personally viewed and interpreted this ECG.   Date: 11/20/2023  EKG Time: 9:52  Rate: 65  Rhythm: sinus  Axis: normal  Intervals: normal  ST&T Change: no stemi, no depressions    RADIOLOGY Please see ED Course for my review and interpretation.  I personally reviewed all radiographic images ordered to evaluate for the above acute complaints and reviewed radiology reports and findings.  These findings were personally discussed with the patient.  Please see medical record for radiology report.    PROCEDURES:  Critical Care performed: No  Procedures   MEDICATIONS ORDERED IN ED: Medications  amLODipine (NORVASC) tablet 5 mg (5 mg Oral Given 11/20/23  1207)  LORazepam (ATIVAN) tablet 0.5 mg (has no administration in time range)     IMPRESSION / MDM / ASSESSMENT AND PLAN / ED COURSE  I reviewed the triage vital signs and the nursing notes.                              Differential diagnosis includes, but is not limited to,htn, essential htn, chf, ckd, tia, cva  Patient presenting to the ER for evaluation of symptoms as described above.  Based on symptoms, risk factors and considered above differential, this presenting complaint could reflect a potentially life-threatening illness therefore the patient will be placed on continuous pulse oximetry and telemetry for monitoring.  Laboratory evaluation will be sent to evaluate for the above complaints.      Clinical Course as of 11/20/23 1410  Sat Nov 20, 2023  1224 Given patient's hypertension  symptoms and risk factors I have recommended MRI to further evaluate for possible CVA.  Patient states that she is claustrophobic.  She is agreeable to trying MRI with dose of p.o. Ativan. [PR]  1408 Patient be signed out oncoming physician pending follow-up MRI.  If negative anticipate patient will be appropriate for outpatient follow-up. [PR]    Clinical Course User Index [PR] Willy Eddy, MD     FINAL CLINICAL IMPRESSION(S) / ED DIAGNOSES   Final diagnoses:  Secondary hypertension  Dizziness     Rx / DC Orders   ED Discharge Orders     None        Note:  This document was prepared using Dragon voice recognition software and may include unintentional dictation errors.    Willy Eddy, MD 11/20/23 [phone]

## 2023-11-20 NOTE — ED Notes (Signed)
1450

## 2023-11-20 NOTE — Discharge Instructions (Signed)
 As we discussed, keep track of your blood pressure medications at home.  I would only check up to 1 or 2 times per day, every 3 hours may cause some extra stress or anxiety which will artificially increase your blood pressure.  Keep track of these numbers and bring them to your primary doctor to discuss any further changes to medications.  Return to the ED with any worsening symptoms

## 2023-11-20 NOTE — ED Provider Notes (Signed)
 Patient received in signout from Dr. Roxan Hockey pending MRI brain to assess for CVA.  MRI brain is reassuring without signs of acute pathology or CVA.  BP is slowly improving and she is asymptomatic and reports feeling better now.  We discussed continuing her antihypertensive medications, BP monitoring at home, PCP follow-up and ED return precautions.  She is suitable for outpatient management.   Delton Prairie, MD 11/20/23 [phone]

## 2024-04-13 ENCOUNTER — Other Ambulatory Visit: Payer: Self-pay | Admitting: Internal Medicine

## 2024-04-13 DIAGNOSIS — R0602 Shortness of breath: Secondary | ICD-10-CM

## 2024-04-13 DIAGNOSIS — I2089 Other forms of angina pectoris: Secondary | ICD-10-CM

## 2024-10-04 ENCOUNTER — Other Ambulatory Visit: Payer: Self-pay | Admitting: Physician Assistant

## 2024-10-04 DIAGNOSIS — Z1231 Encounter for screening mammogram for malignant neoplasm of breast: Secondary | ICD-10-CM

## 2024-11-02 ENCOUNTER — Encounter
